# Patient Record
Sex: Male | Born: 1955 | Race: White | Hispanic: No | Marital: Married | Smoking: Never smoker
Health system: Southern US, Community
[De-identification: ages and names within clinical notes are randomized; demographics above are authoritative.]

## PROBLEM LIST (undated history)

## (undated) DIAGNOSIS — K5790 Diverticulosis of intestine, part unspecified, without perforation or abscess without bleeding: Secondary | ICD-10-CM

## (undated) DIAGNOSIS — C801 Malignant (primary) neoplasm, unspecified: Secondary | ICD-10-CM

## (undated) DIAGNOSIS — I82409 Acute embolism and thrombosis of unspecified deep veins of unspecified lower extremity: Secondary | ICD-10-CM

## (undated) DIAGNOSIS — J189 Pneumonia, unspecified organism: Secondary | ICD-10-CM

## (undated) DIAGNOSIS — M1990 Unspecified osteoarthritis, unspecified site: Secondary | ICD-10-CM

## (undated) HISTORY — PX: FRACTURE SURGERY: SHX138

## (undated) HISTORY — PX: BACK SURGERY: SHX140

## (undated) HISTORY — DX: Diverticulosis of intestine, part unspecified, without perforation or abscess without bleeding: K57.90

## (undated) HISTORY — PX: LOBECTOMY: SHX5089

## (undated) HISTORY — PX: TONSILLECTOMY: SUR1361

## (undated) HISTORY — PX: KNEE SURGERY: SHX244

## (undated) HISTORY — PX: ELBOW SURGERY: SHX618

---

## 1999-08-29 ENCOUNTER — Emergency Department (HOSPITAL_COMMUNITY): Admission: EM | Admit: 1999-08-29 | Discharge: 1999-08-29 | Payer: Self-pay | Admitting: Internal Medicine

## 1999-12-16 ENCOUNTER — Encounter: Admission: RE | Admit: 1999-12-16 | Discharge: 1999-12-16 | Payer: Self-pay | Admitting: Family Medicine

## 1999-12-16 ENCOUNTER — Encounter: Payer: Self-pay | Admitting: Family Medicine

## 1999-12-27 ENCOUNTER — Encounter: Payer: Self-pay | Admitting: Family Medicine

## 1999-12-27 ENCOUNTER — Encounter: Admission: RE | Admit: 1999-12-27 | Discharge: 1999-12-27 | Payer: Self-pay | Admitting: Family Medicine

## 2001-03-08 ENCOUNTER — Encounter: Admission: RE | Admit: 2001-03-08 | Discharge: 2001-03-08 | Payer: Self-pay | Admitting: Family Medicine

## 2001-03-08 ENCOUNTER — Encounter: Payer: Self-pay | Admitting: Family Medicine

## 2001-03-20 ENCOUNTER — Emergency Department (HOSPITAL_COMMUNITY): Admission: EM | Admit: 2001-03-20 | Discharge: 2001-03-20 | Payer: Self-pay | Admitting: Emergency Medicine

## 2002-09-07 ENCOUNTER — Encounter: Admission: RE | Admit: 2002-09-07 | Discharge: 2002-09-07 | Payer: Self-pay | Admitting: Family Medicine

## 2002-09-07 ENCOUNTER — Encounter: Payer: Self-pay | Admitting: Family Medicine

## 2002-11-13 ENCOUNTER — Emergency Department (HOSPITAL_COMMUNITY): Admission: AD | Admit: 2002-11-13 | Discharge: 2002-11-13 | Payer: Self-pay | Admitting: Emergency Medicine

## 2002-11-19 ENCOUNTER — Encounter: Payer: Self-pay | Admitting: Family Medicine

## 2002-11-19 ENCOUNTER — Encounter: Admission: RE | Admit: 2002-11-19 | Discharge: 2002-11-19 | Payer: Self-pay | Admitting: Family Medicine

## 2002-11-29 ENCOUNTER — Ambulatory Visit (HOSPITAL_COMMUNITY): Admission: RE | Admit: 2002-11-29 | Discharge: 2002-11-30 | Payer: Self-pay | Admitting: Neurosurgery

## 2002-11-29 ENCOUNTER — Encounter: Payer: Self-pay | Admitting: Neurosurgery

## 2006-04-15 ENCOUNTER — Emergency Department (HOSPITAL_COMMUNITY): Admission: EM | Admit: 2006-04-15 | Discharge: 2006-04-16 | Payer: Self-pay | Admitting: Emergency Medicine

## 2006-09-27 ENCOUNTER — Encounter: Admission: RE | Admit: 2006-09-27 | Discharge: 2006-09-27 | Payer: Self-pay | Admitting: Orthopedic Surgery

## 2006-10-02 ENCOUNTER — Ambulatory Visit: Payer: Self-pay | Admitting: Vascular Surgery

## 2010-06-29 NOTE — Procedures (Signed)
DUPLEX DEEP VENOUS EXAM - LOWER EXTREMITY   INDICATION:  Right calf pain, status post right knee surgery.   HISTORY:  Edema:  Yes  Trauma/Surgery:  Yes  Pain:  Yes  PE:  No  Previous DVT:  Yes  Anticoagulants:  Yes  Other:   DUPLEX EXAM:                CFV   SFV   PopV  PTV    GSV                R  L  R  L  R  L  R   L  R  L  Thrombosis    0  0  0     0     +      0  Spontaneous   +  +  +     +     0      +  Phasic        +  +  +     +     0      +  Augmentation  +  +  +     +     0      +  Compressible  +  +  +     +     0      +  Competent     +  +  +     +     0      +   Legend:  + - yes  o - no  p - partial  D - decreased   IMPRESSION:  Acute deep venous thrombosis noted in the right posterior  tibial vein, peroneal veins and the soleus veins.    _____________________________  Jason Hora Fields, MD   MG/MEDQ  D:  10/02/2006  T:  10/03/2006  Job:  [id number]

## 2010-07-02 NOTE — Op Note (Signed)
NAME:  Jason Sloan, Jason Sloan                          ACCOUNT NO.:  [id number]   MEDICAL RECORD NO.:  [id number]                   PATIENT TYPE:  OIB   LOCATION:  3009                                 FACILITY:  [facility]   PHYSICIAN:  Coletta Memos, M.D.                  DATE OF BIRTH:  Jun 24, 1955   DATE OF PROCEDURE:  11/29/2002  DATE OF DISCHARGE:                                 OPERATIVE REPORT   PREOPERATIVE DIAGNOSES:  1. Displaced disk, right L4-5.  2. Lumbar radiculopathy.   POSTOPERATIVE DIAGNOSES:  1. Displaced disk, right L4-5.  2. Lumbar radiculopathy.   PROCEDURE:  Right L4-5 semihemilaminectomy and diskectomy with  microdissection.   COMPLICATIONS:  None.   SURGEON:  Coletta Memos, M.D.   ANESTHESIA:  General endotracheal.   FINDINGS:  A large free fragment.  Disk space not entered.   [name] is a 55 year old gentleman with severe pain in his right lower  extremity secondary to a displaced disk.  I recommended and he agreed to go  to the operating room after he desired to have the quickest fix for his  problem.   Mr. Safranek was brought to the operating room, intubated, and placed under  general anesthesia without difficulty.  He was rolled prone onto a Wilson  frame and all pressure points were properly padded.  Skin was prepped and he  was draped in a sterile fashion.  I infiltrated 8 mL of 0.5% lidocaine and  1:200,000 strength epinephrine into the lumbar area into the skin and into  the paraspinous musculature.  I opened the skin with a #10 blade and took  this down to the thoracolumbar fascia.  I then exposed the laminae of what I  believed to be L4 and L5.  X-ray showed that I was in the correct  intralaminar space.  I then using a high-speed drill performed a  semihemilaminectomy.  I brought the microscope into position into the  operative field to aid in microdissection.  I then removed the ligamentum  flavum, exposing the thecal sac and spinal canal.  I  retracted the thecal  sac medially and appreciated a piece of disk which was coming out from  underneath the sac.  I then used a pituitary rongeur, pulled on this piece,  and expressed what was a very large free fragment of disk.  After satisfying  myself about the integrity of the disk space and that I was not able to  express any disk just by pressing on it, I did not feel it was warranted to  go into the disk space since I have removed the large fragment which was  obviously causing the problem.  I then irrigated the wound.  I then closed  the wound in a layered fashion using 0 Vicryl sutures.  Dermabond used for a  sterile dressing.  The patient tolerated the procedure without difficulty.  Coletta Memos, M.D.   KC/MEDQ  D:  11/29/2002  T:  11/30/2002  Job:  [id number]

## 2010-10-16 ENCOUNTER — Inpatient Hospital Stay (INDEPENDENT_AMBULATORY_CARE_PROVIDER_SITE_OTHER)
Admission: RE | Admit: 2010-10-16 | Discharge: 2010-10-16 | Disposition: A | Discharge status: Home / Self Care | Payer: BC Managed Care – PPO | Source: Ambulatory Visit | Attending: Family Medicine | Admitting: Family Medicine

## 2010-10-16 DIAGNOSIS — S91309A Unspecified open wound, unspecified foot, initial encounter: Secondary | ICD-10-CM

## 2014-01-15 ENCOUNTER — Encounter (HOSPITAL_BASED_OUTPATIENT_CLINIC_OR_DEPARTMENT_OTHER): Payer: Self-pay | Admitting: *Deleted

## 2014-01-15 ENCOUNTER — Emergency Department (HOSPITAL_BASED_OUTPATIENT_CLINIC_OR_DEPARTMENT_OTHER): Payer: BC Managed Care – PPO

## 2014-01-15 ENCOUNTER — Emergency Department (HOSPITAL_BASED_OUTPATIENT_CLINIC_OR_DEPARTMENT_OTHER)
Admission: EM | Admit: 2014-01-15 | Discharge: 2014-01-15 | Disposition: A | Discharge status: Home / Self Care | Payer: BC Managed Care – PPO | Attending: Emergency Medicine | Admitting: Emergency Medicine

## 2014-01-15 DIAGNOSIS — R112 Nausea with vomiting, unspecified: Secondary | ICD-10-CM | POA: Insufficient documentation

## 2014-01-15 DIAGNOSIS — R1011 Right upper quadrant pain: Secondary | ICD-10-CM | POA: Diagnosis not present

## 2014-01-15 DIAGNOSIS — Z9889 Other specified postprocedural states: Secondary | ICD-10-CM | POA: Diagnosis not present

## 2014-01-15 DIAGNOSIS — K625 Hemorrhage of anus and rectum: Secondary | ICD-10-CM | POA: Insufficient documentation

## 2014-01-15 DIAGNOSIS — R109 Unspecified abdominal pain: Secondary | ICD-10-CM

## 2014-01-15 DIAGNOSIS — R197 Diarrhea, unspecified: Secondary | ICD-10-CM | POA: Diagnosis not present

## 2014-01-15 DIAGNOSIS — Z880 Allergy status to penicillin: Secondary | ICD-10-CM | POA: Diagnosis not present

## 2014-01-15 LAB — CBC WITH DIFFERENTIAL/PLATELET
Basophils Absolute: 0.1 10*3/uL (ref 0.0–0.1)
Basophils Relative: 1 % (ref 0–1)
EOS ABS: 0.3 10*3/uL (ref 0.0–0.7)
Eosinophils Relative: 3 % (ref 0–5)
HCT: 45.3 % (ref 39.0–52.0)
HEMOGLOBIN: 15.9 g/dL (ref 13.0–17.0)
LYMPHS ABS: 2.6 10*3/uL (ref 0.7–4.0)
Lymphocytes Relative: 32 % (ref 12–46)
MCH: 31.4 pg (ref 26.0–34.0)
MCHC: 35.1 g/dL (ref 30.0–36.0)
MCV: 89.3 fL (ref 78.0–100.0)
MONOS PCT: 12 % (ref 3–12)
Monocytes Absolute: 1 10*3/uL (ref 0.1–1.0)
NEUTROS ABS: 4.2 10*3/uL (ref 1.7–7.7)
NEUTROS PCT: 52 % (ref 43–77)
Platelets: 204 10*3/uL (ref 150–400)
RBC: 5.07 MIL/uL (ref 4.22–5.81)
RDW: 13.3 % (ref 11.5–15.5)
WBC: 8.1 10*3/uL (ref 4.0–10.5)

## 2014-01-15 LAB — COMPREHENSIVE METABOLIC PANEL
ALT: 17 U/L (ref 0–53)
AST: 20 U/L (ref 0–37)
Albumin: 3.6 g/dL (ref 3.5–5.2)
Alkaline Phosphatase: 55 U/L (ref 39–117)
Anion gap: 10 (ref 5–15)
BUN: 16 mg/dL (ref 6–23)
CALCIUM: 9.4 mg/dL (ref 8.4–10.5)
CO2: 29 mEq/L (ref 19–32)
Chloride: 103 mEq/L (ref 96–112)
Creatinine, Ser: 1.1 mg/dL (ref 0.50–1.35)
GFR calc non Af Amer: 73 mL/min — ABNORMAL LOW (ref >90)
GFR, EST AFRICAN AMERICAN: 84 mL/min — AB (ref >90)
GLUCOSE: 71 mg/dL (ref 70–99)
Potassium: 4.5 mEq/L (ref 3.7–5.3)
SODIUM: 142 meq/L (ref 137–147)
TOTAL PROTEIN: 7.3 g/dL (ref 6.0–8.3)
Total Bilirubin: 0.4 mg/dL (ref 0.3–1.2)

## 2014-01-15 LAB — URINALYSIS, ROUTINE W REFLEX MICROSCOPIC
Bilirubin Urine: NEGATIVE
GLUCOSE, UA: NEGATIVE mg/dL
Hgb urine dipstick: NEGATIVE
Ketones, ur: 15 mg/dL — AB
LEUKOCYTES UA: NEGATIVE
Nitrite: NEGATIVE
PH: 5.5 (ref 5.0–8.0)
Protein, ur: NEGATIVE mg/dL
Specific Gravity, Urine: 1.02 (ref 1.005–1.030)
Urobilinogen, UA: 0.2 mg/dL (ref 0.0–1.0)

## 2014-01-15 LAB — LIPASE, BLOOD: Lipase: 36 U/L (ref 11–59)

## 2014-01-15 MED ORDER — MORPHINE SULFATE 4 MG/ML IJ SOLN
4.0000 mg | Freq: Once | INTRAMUSCULAR | Status: AC
  Administered 2014-01-15: 4 mg via INTRAVENOUS
  Filled 2014-01-15: qty 1

## 2014-01-15 MED ORDER — IOHEXOL 300 MG/ML  SOLN
100.0000 mL | Freq: Once | INTRAMUSCULAR | Status: AC | PRN
  Administered 2014-01-15: 100 mL via INTRAVENOUS

## 2014-01-15 MED ORDER — SODIUM CHLORIDE 0.9 % IV BOLUS (SEPSIS)
1000.0000 mL | Freq: Once | INTRAVENOUS | Status: AC
  Administered 2014-01-15: 1000 mL via INTRAVENOUS

## 2014-01-15 MED ORDER — HYDROCODONE-ACETAMINOPHEN 5-325 MG PO TABS: 1.0000 | ORAL_TABLET | Freq: Four times a day (QID) | ORAL | Status: DC | PRN

## 2014-01-15 MED ORDER — ONDANSETRON HCL 4 MG/2ML IJ SOLN
4.0000 mg | Freq: Once | INTRAMUSCULAR | Status: AC
  Administered 2014-01-15: 4 mg via INTRAVENOUS
  Filled 2014-01-15: qty 2

## 2014-01-15 MED ORDER — IOHEXOL 300 MG/ML  SOLN
50.0000 mL | Freq: Once | INTRAMUSCULAR | Status: AC | PRN
  Administered 2014-01-15: 50 mL via ORAL

## 2014-01-15 NOTE — ED Notes (Addendum)
Pt c/o abd pain n/v  x 8 hrs and rectal bleeding x 2 hrs

## 2014-01-15 NOTE — ED Notes (Signed)
Patient transported to CT 

## 2014-01-15 NOTE — Discharge Instructions (Signed)
Hydrocodone as prescribed as needed for pain.  Call [facility] to arrange a follow-up appointment to discuss a colonoscopy, and return to the ER if you develop any worsening symptoms.   Abdominal Pain Many things can cause abdominal pain. Usually, abdominal pain is not caused by a disease and will improve without treatment. It can often be observed and treated at home. Your health care provider will do a physical exam and possibly order blood tests and X-rays to help determine the seriousness of your pain. However, in many cases, more time must pass before a clear cause of the pain can be found. Before that point, your health care provider may not know if you need more testing or further treatment. HOME CARE INSTRUCTIONS  Monitor your abdominal pain for any changes. The following actions may help to alleviate any discomfort you are experiencing:  Only take over-the-counter or prescription medicines as directed by your health care provider.  Do not take laxatives unless directed to do so by your health care provider.  Try a clear liquid diet (broth, tea, or water) as directed by your health care provider. Slowly move to a bland diet as tolerated. SEEK MEDICAL CARE IF:  You have unexplained abdominal pain.  You have abdominal pain associated with nausea or diarrhea.  You have pain when you urinate or have a bowel movement.  You experience abdominal pain that wakes you in the night.  You have abdominal pain that is worsened or improved by eating food.  You have abdominal pain that is worsened with eating fatty foods.  You have a fever. SEEK IMMEDIATE MEDICAL CARE IF:   Your pain does not go away within 2 hours.  You keep throwing up (vomiting).  Your pain is felt only in portions of the abdomen, such as the right side or the left lower portion of the abdomen.  You pass bloody or black tarry stools. MAKE SURE YOU:  Understand these instructions.   Will watch your  condition.   Will get help right away if you are not doing well or get worse.  Document Released: 11/10/2004 Document Revised: 02/05/2013 Document Reviewed: 10/10/2012 [facility] Patient Information 2015 [place], [place]. This information is not intended to replace advice given to you by your health care provider. Make sure you discuss any questions you have with your health care provider.  Bloody Stools Bloody stools often mean that there is a problem in the digestive tract. Your caregiver may use the term "melena" to describe black, tarry, and bad smelling stools or "hematochezia" to describe red or maroon-colored stools. Blood seen in the stool can be caused by bleeding anywhere along the intestinal tract.  A black stool usually means that blood is coming from the upper part of the gastrointestinal tract (esophagus, stomach, or small bowel). Passing maroon-colored stools or bright red blood usually means that blood is coming from lower down in the large bowel or the rectum. However, sometimes massive bleeding in the stomach or small intestine can cause bright red bloody stools.  Consuming black licorice, lead, iron pills, medicines containing bismuth subsalicylate, or blueberries can also cause black stools. Your caregiver can test black stools to see if blood is present. It is important that the cause of the bleeding be found. Treatment can then be started, and the problem can be corrected. Rectal bleeding may not be serious, but you should not assume everything is okay until you know the cause.It is very important to follow up with your caregiver or  a specialist in gastrointestinal problems. CAUSES  Blood in the stools can come from various underlying causes.Often, the cause is not found during your first visit. Testing is often needed to discover the cause of bleeding in the gastrointestinal tract. Causes range from simple to serious or even life-threatening.Possible causes  include:  Hemorrhoids.These are veins that are full of blood (engorged) in the rectum. They cause pain, inflammation, and may bleed.  Anal fissures.These are areas of painful tearing which may bleed. They are often caused by passing hard stool.  Diverticulosis.These are pouches that form on the colon over time, with age, and may bleed significantly.  Diverticulitis.This is inflammation in areas with diverticulosis. It can cause pain, fever, and bloody stools, although bleeding is rare.  Proctitis and colitis. These are inflamed areas of the rectum or colon. They may cause pain, fever, and bloody stools.  Polyps and cancer. Colon cancer is a leading cause of preventable cancer death.It often starts out as precancerous polyps that can be removed during a colonoscopy, preventing progression into cancer. Sometimes, polyps and cancer may cause rectal bleeding.  Gastritis and ulcers.Bleeding from the upper gastrointestinal tract (near the stomach) may travel through the intestines and produce black, sometimes tarry, often bad smelling stools. In certain cases, if the bleeding is fast enough, the stools may not be black, but red and the condition may be life-threatening. SYMPTOMS  You may have stools that are bright red and bloody, that are normal color with blood on them, or that are dark black and tarry. In some cases, you may only have blood in the toilet bowl. Any of these cases need medical care. You may also have:  Pain at the anus or anywhere in the rectum.  Lightheadedness or feeling faint.  Extreme weakness.  Nausea or vomiting.  Fever. DIAGNOSIS Your caregiver may use the following methods to find the cause of your bleeding:  Taking a medical history. Age is important. Older people tend to develop polyps and cancer more often. If there is anal pain and a hard, large stool associated with bleeding, a tear of the anus may be the cause. If blood drips into the toilet after a bowel  movement, bleeding hemorrhoids may be the problem. The color and frequency of the bleeding are additional considerations. In most cases, the medical history provides clues, but seldom the final answer.  A visual and finger (digital) exam. Your caregiver will inspect the anal area, looking for tears and hemorrhoids. A finger exam can provide information when there is tenderness or a growth inside. In men, the prostate is also examined.  Endoscopy. Several types of small, long scopes (endoscopes) are used to view the colon.  In the office, your caregiver may use a rigid, or more commonly, a flexible viewing sigmoidoscope. This exam is called flexible sigmoidoscopy. It is performed in 5 to 10 minutes.  A more thorough exam is accomplished with a colonoscope. It allows your caregiver to view the entire 5 to 6 foot long colon. Medicine to help you relax (sedative) is usually given for this exam. Frequently, a bleeding lesion may be present beyond the reach of the sigmoidoscope. So, a colonoscopy may be the best exam to start with. Both exams are usually done on an outpatient basis. This means the patient does not stay overnight in the hospital or surgery center.  An upper endoscopy may be needed to examine your stomach. Sedation is used and a flexible endoscope is put in your mouth, down to  your stomach.  A barium enema X-ray. This is an X-ray exam. It uses liquid barium inserted by enema into the rectum. This test alone may not identify an actual bleeding point. X-rays highlight abnormal shadows, such as those made by lumps (tumors), diverticuli, or colitis. TREATMENT  Treatment depends on the cause of your bleeding.   For bleeding from the stomach or colon, the caregiver doing your endoscopy or colonoscopy may be able to stop the bleeding as part of the procedure.  Inflammation or infection of the colon can be treated with medicines.  Many rectal problems can be treated with creams, suppositories,  or warm baths.  Surgery is sometimes needed.  Blood transfusions are sometimes needed if you have lost a lot of blood.  For any bleeding problem, let your caregiver know if you take aspirin or other blood thinners regularly. HOME CARE INSTRUCTIONS   Take any medicines exactly as prescribed.  Keep your stools soft by eating a diet high in fiber. Prunes (1 to 3 a day) work well for many people.  Drink enough water and fluids to keep your urine clear or pale yellow.  Take sitz baths if advised. A sitz bath is when you sit in a bathtub with warm water for 10 to 15 minutes to soak, soothe, and cleanse the rectal area.  If enemas or suppositories are advised, be sure you know how to use them. Tell your caregiver if you have problems with this.  Monitor your bowel movements to look for signs of improvement or worsening. SEEK MEDICAL CARE IF:   You do not improve in the time expected.  Your condition worsens after initial improvement.  You develop any new symptoms. SEEK IMMEDIATE MEDICAL CARE IF:   You develop severe or prolonged rectal bleeding.  You vomit blood.  You feel weak or faint.  You have a fever. MAKE SURE YOU:  Understand these instructions.  Will watch your condition.  Will get help right away if you are not doing well or get worse. Document Released: 01/21/2002 Document Revised: 04/25/2011 Document Reviewed: 06/18/2010 [facility] Patient Information 2015 [place], [place]. This information is not intended to replace advice given to you by your health care provider. Make sure you discuss any questions you have with your health care provider.

## 2014-01-15 NOTE — ED Provider Notes (Signed)
CSN: [id number]     Arrival date & time 01/15/14  1614 History   First MD Initiated Contact with Patient 01/15/14 1649     Chief Complaint  Patient presents with  . Rectal Bleeding     (Consider location/radiation/quality/duration/timing/severity/associated sxs/prior Treatment) HPI Comments: Patient is a 58 year old male with a prior history of lung and back surgery. He presents today with complaints of right upper quadrant pain that started this morning and has been ongoing for the last 8 hours. He reports nausea and vomiting. He had a bowel movement this afternoon and noticed blood afterward when he wiped. He denies to me he is having any fevers or chills. He denies any urinary complaints. He has no prior abdominal surgeries and denies having had a colonoscopy in the past.  Patient is a 58 y.o. male presenting with hematochezia. The history is provided by the patient.  Rectal Bleeding Quality:  Bright red Amount:  Scant Duration:  8 hours Timing:  Constant Progression:  Worsening Chronicity:  New Context: diarrhea   Context: not anal fissures and not constipation   Relieved by:  Nothing Worsened by:  Nothing tried Ineffective treatments:  None tried Associated symptoms: abdominal pain   Associated symptoms: no fever     History reviewed. No pertinent past medical history. Past Surgical History  Procedure Laterality Date  . Back surgery    . Joint replacement    . Lobectomy    . Tonsillectomy     History reviewed. No pertinent family history. History  Substance Use Topics  . Smoking status: Never Smoker   . Smokeless tobacco: Not on file  . Alcohol Use: No    Review of Systems  Constitutional: Negative for fever.  Gastrointestinal: Positive for abdominal pain and hematochezia.  All other systems reviewed and are negative.     Allergies  Penicillins  Home Medications   Prior to Admission medications   Not on File   BP 125/71 mmHg  Pulse 60  Temp(Src)  98.2 F (36.8 C) (Oral)  Resp 18  Ht 6\' 4"  (1.93 m)  Wt 240 lb (108.863 kg)  BMI 29.23 kg/m2  SpO2 100% Physical Exam  Constitutional: He is oriented to person, place, and time. He appears well-developed and well-nourished. No distress.  HENT:  Head: Normocephalic and atraumatic.  Mouth/Throat: Oropharynx is clear and moist.  Neck: Normal range of motion. Neck supple.  Cardiovascular: Normal rate, regular rhythm and normal heart sounds.   No murmur heard. Pulmonary/Chest: Effort normal and breath sounds normal. No respiratory distress.  Abdominal: Soft. Bowel sounds are normal. He exhibits no distension. There is tenderness. There is no rebound and no guarding.  There is tenderness to palpation in the right upper quadrant.  Musculoskeletal: Normal range of motion. He exhibits no edema.  Neurological: He is alert and oriented to person, place, and time.  Skin: Skin is warm and dry. He is not diaphoretic.  Nursing note and vitals reviewed.   ED Course  Procedures (including critical care time) Labs Review Labs Reviewed  COMPREHENSIVE METABOLIC PANEL  CBC WITH DIFFERENTIAL  LIPASE, BLOOD  URINALYSIS, ROUTINE W REFLEX MICROSCOPIC    Imaging Review No results found.   EKG Interpretation None      MDM   Final diagnoses:  None    Patient is a 58 year old male who presents with complaints of significant right upper quadrant discomfort and a bloody bowel movement that occurred shortly after the onset of pain. On exam, he is tender to  palpation in the right midabdomen with no rebound or guarding. Workup reveals no elevation of white count, normal laboratory studies. An ultrasound was obtained initially which revealed sludge but no evidence for cholelithiasis, and CT scan reveals no acute abnormality.  I am uncertain as to the exact etiology of the patient's discomfort, however nothing appears emergent. He will be discharged home with pain medication and when necessary return.  Due to the bloody bowel movement, I will advise him to call his gastroenterologist to arrange a follow-up appointment to discuss a colonoscopy.    Veryl Speak, MD 01/15/14 2123

## 2014-01-17 ENCOUNTER — Encounter: Payer: Self-pay | Admitting: *Deleted

## 2014-01-20 ENCOUNTER — Encounter: Payer: Self-pay | Admitting: Physician Assistant

## 2014-01-20 ENCOUNTER — Other Ambulatory Visit (INDEPENDENT_AMBULATORY_CARE_PROVIDER_SITE_OTHER): Payer: BC Managed Care – PPO

## 2014-01-20 ENCOUNTER — Ambulatory Visit (INDEPENDENT_AMBULATORY_CARE_PROVIDER_SITE_OTHER): Payer: BC Managed Care – PPO | Admitting: Physician Assistant

## 2014-01-20 VITALS — BP 112/64 | HR 60 | Ht 76.0 in | Wt 238.0 lb

## 2014-01-20 DIAGNOSIS — K625 Hemorrhage of anus and rectum: Secondary | ICD-10-CM

## 2014-01-20 DIAGNOSIS — R1011 Right upper quadrant pain: Secondary | ICD-10-CM | POA: Insufficient documentation

## 2014-01-20 DIAGNOSIS — Z1211 Encounter for screening for malignant neoplasm of colon: Secondary | ICD-10-CM

## 2014-01-20 LAB — HEPATIC FUNCTION PANEL
ALK PHOS: 52 U/L (ref 39–117)
ALT: 19 U/L (ref 0–53)
AST: 19 U/L (ref 0–37)
Albumin: 4.1 g/dL (ref 3.5–5.2)
BILIRUBIN DIRECT: 0.1 mg/dL (ref 0.0–0.3)
Total Bilirubin: 0.8 mg/dL (ref 0.2–1.2)
Total Protein: 7.2 g/dL (ref 6.0–8.3)

## 2014-01-20 LAB — LIPASE: LIPASE: 36 U/L (ref 11.0–59.0)

## 2014-01-20 MED ORDER — MOVIPREP 100 G PO SOLR: 1.0000 | Freq: Once | ORAL | Status: DC

## 2014-01-20 MED ORDER — PANTOPRAZOLE SODIUM 40 MG PO TBEC
40.0000 mg | DELAYED_RELEASE_TABLET | Freq: Every day | ORAL | Status: DC
Start: 2014-01-20 — End: 2017-05-18

## 2014-01-20 NOTE — Progress Notes (Signed)
Patient ID: Jason Sloan, male   DOB: Oct 28, 1955, 58 y.o.   MRN: [id number]    HPI:  [name] is a 58 year old male referred for evaluation by Dr. Tennis Must low due to right upper quadrant abdominal pain. Patient has a history of prior lung and back surgery.  Patient states that last Wednesday he developed severe right upper quadrant pain that radiated through to the area of his right shoulder blade. The pain did not occur postprandially. The pain was associated with severe nausea and several bouts of vomiting the pain lasted for 8-10 hours. Towards the end of the episode he had a bowel movement and noticed a small amount of blood on the toilet tissue. He had no associated fever or chills and had no urinary complaints. His right upper quadrant pain has remained constant and dull but is quite bothersome for the patient. He has not had any further nausea or vomiting but he states the pain continues to feel like an ache from the right upper quadrant through to the right shoulder blade. He has not had any dark urine or light stools. He was seen in the emergency room and had a CT of the abdomen that showed colonic diverticulosis without diverticulitis. He had an ultrasound that showed a small echogenic gallbladder polyp and layering echogenic sludge but no gallstones or findings for acute cholecystitis. Patient is anxious to have further evaluation of this discomfort as he states his sister had her gallbladder removed in her 18s, his mother had her gallbladder removed in her 9s, and his father had gallbladder cancer. The patient was given a prescription for Norco for his pain in the emergency room. He has only used 3 tablets in the past week as it provides little relief. He denies heartburn, but states he feels very bloated and has been belching and burping a lot.    Past Medical History  Diagnosis Date  . Diverticulosis   . Fatty liver 01/15/14  . Gallbladder polyp   . Kidney cysts     Past Surgical History    Procedure Laterality Date  . Back surgery    . Knee surgery Right   . Lobectomy Right     due to cryptococcal pneumonia  . Tonsillectomy     Family History  Problem Relation Age of Onset  . Stomach cancer Father     ??  . Colon cancer Neg Hx   . Throat cancer Neg Hx   . Pancreatic cancer Neg Hx   . Diabetes Maternal Grandfather   . Kidney disease Neg Hx   . Liver disease Neg Hx   . Heart disease Neg Hx    History  Substance Use Topics  . Smoking status: Never Smoker   . Smokeless tobacco: Current User     Comment: occasional  . Alcohol Use: 0.0 oz/week    0 Not specified per week     Comment: seldom   Current Outpatient Prescriptions  Medication Sig Dispense Refill  . HYDROcodone-acetaminophen (NORCO) 5-325 MG per tablet Take 1-2 tablets by mouth every 6 (six) hours as needed. 12 tablet 0  . MOVIPREP 100 G SOLR Take 1 kit (200 g total) by mouth once. 1 kit 0  . pantoprazole (PROTONIX) 40 MG tablet Take 1 tablet (40 mg total) by mouth daily. 3 tablet 2   No current facility-administered medications for this visit.   Allergies  Allergen Reactions  . Penicillins      Review of Systems: Gen: Denies any fever, chills,  sweats, anorexia, fatigue, weakness, malaise, weight loss, and sleep disorder CV: Denies chest pain, angina, palpitations, syncope, orthopnea, PND, peripheral edema, and claudication. Resp: Denies dyspnea at rest, dyspnea with exercise, cough, sputum, wheezing, coughing up blood, and pleurisy. GI: Denies vomiting blood, jaundice, and fecal incontinence.   Denies dysphagia or odynophagia. GU : Denies urinary burning, blood in urine, urinary frequency, urinary hesitancy, nocturnal urination, and urinary incontinence. MS: Denies joint pain, limitation of movement, and swelling, stiffness, low back pain, extremity pain. Denies muscle weakness, cramps, atrophy.  Derm: Denies rash, itching, dry skin, hives, moles, warts, or unhealing ulcers.  Psych: Denies  depression, anxiety, memory loss, suicidal ideation, hallucinations, paranoia, and confusion. Heme: Denies bruising, bleeding, and enlarged lymph nodes. Neuro:  Denies any headaches, dizziness, paresthesias. Endo:  Denies any problems with DM, thyroid, adrenal function  Studies: US Abdomen Complete  01/15/2014   CLINICAL DATA:  Acute onset of severe right upper quadrant abdominal pain approximately 8-9 hr ago with rectal bleeding.  EXAM: ULTRASOUND ABDOMEN COMPLETE  COMPARISON:  None.  FINDINGS: Gallbladder: A small 3 mm echogenic polyp is noted. There is a small amount of layering echogenic sludge but no gallstones, wall thickening or pericholecystic fluid.  Common bile duct: Diameter: 4 mm  Liver: There is diffuse increased echogenicity of the liver and decreased through transmission consistent with fatty infiltration. No focal lesions or biliary dilatation.  IVC: Normal caliber  Pancreas: Sonographically unremarkable  Spleen: Normal size and echogenicity without focal lesions  Right Kidney: Length: 11.6 cm. Normal renal cortical thickness and echogenicity without focal lesions or hydronephrosis. A 3.3 x 2.7 x 2.5 cm cyst is noted.  Left Kidney: Length: 12.8 cm. Normal renal cortical thickness and echogenicity without focal lesions or hydronephrosis. A 2.4 x 2.1 x 1.7 cm cyst is noted.  Abdominal aorta: Normal caliber  Other findings: None.  IMPRESSION: 1. Small echogenic gallbladder polyp and layering echogenic sludge but no gallstones or findings for acute cholecystitis. 2. Normal caliber common bile duct. 3. Diffuse fatty infiltration of the liver. 4. Bilateral simple appearing renal cysts.   Electronically Signed   By: Kalman Jewels M.D.   On: 01/15/2014 18:44   Ct Abdomen Pelvis W Contrast  01/15/2014   CLINICAL DATA:  58 year old male with right-sided abdominal and pelvic pain and rectal bleeding for 1 day. Initial encounter.  EXAM: CT ABDOMEN AND PELVIS WITH CONTRAST  TECHNIQUE: Multidetector CT  imaging of the abdomen and pelvis was performed using the standard protocol following bolus administration of intravenous contrast.  CONTRAST:  199m OMNIPAQUE IOHEXOL 300 MG/ML  SOLN  COMPARISON:  01/15/2014 abdominal ultrasound.  04/16/2006 chest CT.  FINDINGS: Two right lower lobe nodules are unchanged from 2008-benign.  A right hepatic cyst is again noted but the liver is otherwise unremarkable.  The spleen, adrenal glands, pancreas, gallbladder and kidneys are unremarkable except for bilateral renal cysts.  There is no evidence of free fluid, enlarged lymph nodes, biliary dilation or abdominal aortic aneurysm.  The bowel, bladder and appendix are unremarkable except for descending and sigmoid colonic diverticulosis. There is no evidence of bowel obstruction, abscess or pneumoperitoneum.  Mild prostate enlargement noted.  No acute or suspicious bony abnormalities are identified.  IMPRESSION: No evidence of acute abnormality.  Colonic diverticulosis without diverticulitis.  Mild prostate enlargement.   Electronically Signed   By: JHassan RowanM.D.   On: 01/15/2014 21:11    LAB RESULTS: Chem profile on 01/15/2014 showed alkaline phosphatase 55, lipase 36, AST 20, ALT  17, total bili 0.4. CBC on 01/15/2014 had a white blood cell count 8.1, hemoglobin 15.9, hematocrit 45.3, platelets 204,000.    Physical Exam: BP 112/64 mmHg  Pulse 60  Ht _0  (1.93 m)  Wt 238 lb (107.956 kg)  BMI 28.98 kg/m2 Constitutional: Pleasant,well-developed, male in no acute distress. HEENT: Normocephalic and atraumatic. Conjunctivae are normal. No scleral icterus. Neck supple.  Cardiovascular: Normal rate, regular rhythm.  Pulmonary/chest: Effort normal and breath sounds normal. No wheezing, rales or rhonchi. Abdominal: Soft, nondistended, tender RUQ, no rebound or guarding. Bowel sounds active throughout. There are no masses palpable. No hepatomegaly. Extremities: no edema Lymphadenopathy: No cervical adenopathy  noted. Neurological: Alert and oriented to person place and time. Skin: Skin is warm and dry. No rashes noted. Psychiatric: Normal mood and affect. Behavior is normal.  ASSESSMENT AND PLAN: #1 right upper quadrant pain. Patient continues to have right upper quadrant pain radiating to the right scapula associated with belching, burping, and bloating. His ultrasound did show a gallbladder polyp and some sludge, but no cholecystitis and no ductal dilatation. An antireflux regimen has been reviewed with the patient. He will be given a trial of pantoprazole 40 mg by mouth every morning 30 minutes prior to breakfast. He will be scheduled for an EGD to assess for possible esophagitis, gastritis, ulcer, or duodenitis.The risks, benefits, and alternatives to endoscopy with possible biopsy and possible dilation were discussed with the patient and they consent to proceed. If the EGD is nonrevealing, he will be considered for a HIDA scan for possible surgical evaluation.  #2. Need for colorectal cancer screening. Patient has expressed interest in being scheduled for a colonoscopy. This will be scheduled on an elective basis after his other problems are sorted out.The risks, benefits, and alternatives to colonoscopy with possible biopsy and possible polypectomy were discussed with the patient and they consent to proceed.   Lindsay Soulliere, Vita Barley PA-C 01/20/2014, 11:36 AM

## 2014-01-20 NOTE — Patient Instructions (Signed)
You have been scheduled for an endoscopy. Please follow written instructions given to you at your visit today. If you use inhalers (even only as needed), please bring them with you on the day of your procedure. Your physician has requested that you go to www.startemmi.com and enter the access code given to you at your visit today. This web site gives a general overview about your procedure. However, you should still follow specific instructions given to you by our office regarding your preparation for the procedure.  You have been scheduled for a colonoscopy. Please follow written instructions given to you at your visit today.  Please pick up your prep kit at the pharmacy within the next 1-3 days. If you use inhalers (even only as needed), please bring them with you on the day of your procedure. Your physician has requested that you go to www.startemmi.com and enter the access code given to you at your visit today. This web site gives a general overview about your procedure. However, you should still follow specific instructions given to you by our office regarding your preparation for the procedure.  We have sent the following medications to your pharmacy for you to pick up at your convenience: Pantoprazole 40 mg every morning 30 minutes prior to breakfast  Food Choices for Gastroesophageal Reflux Disease When you have gastroesophageal reflux disease (GERD), the foods you eat and your eating habits are very important. Choosing the right foods can help ease the discomfort of GERD. WHAT GENERAL GUIDELINES DO I NEED TO FOLLOW?  Choose fruits, vegetables, whole grains, low-fat dairy products, and low-fat meat, fish, and poultry.  Limit fats such as oils, salad dressings, butter, nuts, and avocado.  Keep a food diary to identify foods that cause symptoms.  Avoid foods that cause reflux. These may be different for different people.  Eat frequent small meals instead of three large meals each  day.  Eat your meals slowly, in a relaxed setting.  Limit fried foods.  Cook foods using methods other than frying.  Avoid drinking alcohol.  Avoid drinking large amounts of liquids with your meals.  Avoid bending over or lying down until 2-3 hours after eating. WHAT FOODS ARE NOT RECOMMENDED? The following are some foods and drinks that may worsen your symptoms: Vegetables Tomatoes. Tomato juice. Tomato and spaghetti sauce. Chili peppers. Onion and garlic. Horseradish. Fruits Oranges, grapefruit, and lemon (fruit and juice). Meats High-fat meats, fish, and poultry. This includes hot dogs, ribs, ham, sausage, salami, and bacon. Dairy Whole milk and chocolate milk. Sour cream. Cream. Butter. Ice cream. Cream cheese.  Beverages Coffee and tea, with or without caffeine. Carbonated beverages or energy drinks. Condiments Hot sauce. Barbecue sauce.  Sweets/Desserts Chocolate and cocoa. Donuts. Peppermint and spearmint. Fats and Oils High-fat foods, including [place] fries and potato chips. Other Vinegar. Strong spices, such as black pepper, white pepper, red pepper, cayenne, curry powder, cloves, ginger, and chili powder. The items listed above may not be a complete list of foods and beverages to avoid. Contact your dietitian for more information. Document Released: 01/31/2005 Document Revised: 02/05/2013 Document Reviewed: 12/05/2012 [facility] Patient Information 2015 [place], [place]. This information is not intended to replace advice given to you by your health care provider. Make sure you discuss any questions you have with your health care provider. ____________________________________________________________________________________________________________________________________________________ Your physician has requested that you go to the basement for the following lab work before leaving today: Lipase, Hepatic Panel  Cc:  Kathryne Eriksson

## 2014-01-20 NOTE — Progress Notes (Signed)
I agree with the above note, plan 

## 2014-01-24 ENCOUNTER — Encounter: Payer: Self-pay | Admitting: Gastroenterology

## 2014-01-24 ENCOUNTER — Ambulatory Visit (AMBULATORY_SURGERY_CENTER): Payer: BC Managed Care – PPO | Admitting: Gastroenterology

## 2014-01-24 VITALS — BP 111/71 | HR 43 | Temp 97.3°F | Resp 18 | Ht 76.0 in | Wt 238.0 lb

## 2014-01-24 DIAGNOSIS — K2970 Gastritis, unspecified, without bleeding: Secondary | ICD-10-CM

## 2014-01-24 DIAGNOSIS — K2990 Gastroduodenitis, unspecified, without bleeding: Secondary | ICD-10-CM

## 2014-01-24 DIAGNOSIS — R1011 Right upper quadrant pain: Secondary | ICD-10-CM

## 2014-01-24 DIAGNOSIS — K2950 Unspecified chronic gastritis without bleeding: Secondary | ICD-10-CM

## 2014-01-24 MED ORDER — SODIUM CHLORIDE 0.9 % IV SOLN: 500.0000 mL | INTRAVENOUS | Status: DC

## 2014-01-24 NOTE — Progress Notes (Signed)
Report to PACU, RN, vss, BBS= Clear.  

## 2014-01-24 NOTE — Progress Notes (Signed)
Called to room to assist during endoscopic procedure.  Patient ID and intended procedure confirmed with present staff. Received instructions for my participation in the procedure from the performing physician.  

## 2014-01-24 NOTE — Patient Instructions (Signed)

## 2014-01-24 NOTE — Op Note (Signed)
[facility]  [address] [place], [address]   ENDOSCOPY PROCEDURE REPORT  PATIENT: Jason Sloan, Jason Sloan  MR#: [id number] BIRTHDATE: 09-Mar-1955 , 38  yrs. old GENDER: male ENDOSCOPIST: Milus Banister, MD REFERRED BY:  Kathryne Eriksson, MD PROCEDURE DATE:  01/24/2014 PROCEDURE:  EGD w/ biopsy ASA CLASS:     Class II INDICATIONS:  RUQ abdominal pain; normal CBC, cmet; US showed sludge, ?GB polyp but no obvious stones. MEDICATIONS: Monitored anesthesia care and Propofol 200 mg IV TOPICAL ANESTHETIC: none  DESCRIPTION OF PROCEDURE: After the risks benefits and alternatives of the procedure were thoroughly explained, informed consent was obtained.  The LB [id number] endoscope was introduced through the mouth and advanced to the second portion of the duodenum , Without limitations.  The instrument was slowly withdrawn as the mucosa was fully examined.  There was mild, non-specific pan gastritis.  This was biopsied and sent to pathology.  The examination was otherwise normal. Retroflexed views revealed no abnormalities.     The scope was then withdrawn from the patient and the procedure completed.  COMPLICATIONS: There were no immediate complications.  ENDOSCOPIC IMPRESSION: There was mild, non-specific pan gastritis.  This was biopsied and sent to pathology.  The examination was otherwise normal  RECOMMENDATIONS: If biopsies are positive for H.  pylori, you will be started on appropriate antibiotics.  IF not, then will likely arrange for futher gallbladder testing (HIDA scan).    eSigned:  Milus Banister, MD 01/24/2014 1:33 PM

## 2014-01-27 ENCOUNTER — Telehealth: Payer: Self-pay | Admitting: *Deleted

## 2014-01-27 NOTE — Telephone Encounter (Signed)
  Follow up Call-  Call back number 01/24/2014  Post procedure Call Back phone  # [phone]  Permission to leave phone message Yes     Patient questions:  Do you have a fever, pain , or abdominal swelling? No. Pain Score  0 *  Have you tolerated food without any problems? Yes.    Have you been able to return to your normal activities? Yes.    Do you have any questions about your discharge instructions: Diet   No. Medications  No. Follow up visit  No.  Do you have questions or concerns about your Care? No.  Actions: * If pain score is 4 or above: No action needed, pain <4.

## 2014-02-04 ENCOUNTER — Telehealth: Payer: Self-pay | Admitting: Gastroenterology

## 2014-02-04 DIAGNOSIS — R1011 Right upper quadrant pain: Secondary | ICD-10-CM

## 2014-02-04 NOTE — Telephone Encounter (Signed)
Jason Sloan, please call him. Biopsies from stomach showed no H. Pylori. Still not clear what is causing his RUQ symptoms, want further testing of the GB with HIDA, cck to estimate GB EF. Thanks                 You have been scheduled for a HIDA scan at [facility]iology (1st floor) on 02/17/14. Please arrive 15 minutes prior to your scheduled appointment at  1 pm. Make certain not to have anything to eat or drink at least 6 hours prior to your test. Should this appointment date or time not work well for you, please call radiology scheduling at [phone].  _____________________________________________________________________ hepatobiliary (HIDA) scan is an imaging procedure used to diagnose problems in the liver, gallbladder and bile ducts. In the HIDA scan, a radioactive chemical or tracer is injected into a vein in your arm. The tracer is handled by the liver like bile. Bile is a fluid produced and excreted by your liver that helps your digestive system break down fats in the foods you eat. Bile is stored in your gallbladder and the gallbladder releases the bile when you eat a meal. A special nuclear medicine scanner (gamma camera) tracks the flow of the tracer from your liver into your gallbladder and small intestine.  During your HIDA scan  You'll be asked to change into a hospital gown before your HIDA scan begins. Your health care team will position you on a table, usually on your back. The radioactive tracer is then injected into a vein in your arm.The tracer travels through your bloodstream to your liver, where it's taken up by the bile-producing cells. The radioactive tracer travels with the bile from your liver into your gallbladder and through your bile ducts to your small intestine.You may feel some pressure while the radioactive tracer is injected into your vein. As you lie on the table, a special gamma camera is positioned over your abdomen taking pictures of the tracer as it moves  through your body. The gamma camera takes pictures continually for about an hour. You'll need to keep still during the HIDA scan. This can become uncomfortable, but you may find that you can lessen the discomfort by taking deep breaths and thinking about other things. Tell your health care team if you're uncomfortable. The radiologist will watch on a computer the progress of the radioactive tracer through your body. The HIDA scan may be stopped when the radioactive tracer is seen in the gallbladder and enters your small intestine. This typically takes about an hour. In some cases extra imaging will be performed if original images aren't satisfactory, if morphine is given to help visualize the gallbladder or if the medication CCK is given to look at the contraction of the gallbladder. This test typically takes 2 hours to complete. ________________________________  Pt has been notified and instructed

## 2014-02-10 ENCOUNTER — Encounter: Payer: Self-pay | Admitting: Gastroenterology

## 2014-02-17 ENCOUNTER — Ambulatory Visit (HOSPITAL_COMMUNITY)
Admission: RE | Admit: 2014-02-17 | Discharge: 2014-02-17 | Disposition: A | Discharge status: Home / Self Care | Payer: BC Managed Care – PPO | Source: Ambulatory Visit | Attending: Gastroenterology | Admitting: Gastroenterology

## 2014-02-17 DIAGNOSIS — R1011 Right upper quadrant pain: Secondary | ICD-10-CM | POA: Diagnosis present

## 2014-02-17 MED ORDER — TECHNETIUM TC 99M MEBROFENIN IV KIT
5.1000 | PACK | Freq: Once | INTRAVENOUS | Status: AC | PRN
  Administered 2014-02-17: 5 via INTRAVENOUS

## 2014-02-17 MED ORDER — SINCALIDE 5 MCG IJ SOLR
0.0200 ug/kg | Freq: Once | INTRAMUSCULAR | Status: AC
  Administered 2014-02-17: 2.2 ug via INTRAVENOUS

## 2014-03-21 ENCOUNTER — Encounter: Payer: Self-pay | Admitting: Gastroenterology

## 2014-03-21 ENCOUNTER — Ambulatory Visit (AMBULATORY_SURGERY_CENTER): Payer: BC Managed Care – PPO | Admitting: Gastroenterology

## 2014-03-21 ENCOUNTER — Encounter: Payer: BC Managed Care – PPO | Admitting: Gastroenterology

## 2014-03-21 VITALS — BP 115/45 | HR 61 | Temp 96.8°F | Resp 16 | Ht 76.0 in | Wt 238.0 lb

## 2014-03-21 DIAGNOSIS — Z1211 Encounter for screening for malignant neoplasm of colon: Secondary | ICD-10-CM

## 2014-03-21 DIAGNOSIS — D125 Benign neoplasm of sigmoid colon: Secondary | ICD-10-CM

## 2014-03-21 DIAGNOSIS — D120 Benign neoplasm of cecum: Secondary | ICD-10-CM

## 2014-03-21 MED ORDER — SODIUM CHLORIDE 0.9 % IV SOLN: 500.0000 mL | INTRAVENOUS | Status: DC

## 2014-03-21 NOTE — Progress Notes (Signed)
Called to room to assist during endoscopic procedure.  Patient ID and intended procedure confirmed with present staff. Received instructions for my participation in the procedure from the performing physician.  

## 2014-03-21 NOTE — Progress Notes (Signed)
Procedure ends, to recovery, report given and VSS. 

## 2014-03-21 NOTE — Patient Instructions (Signed)
YOU HAD AN ENDOSCOPIC PROCEDURE TODAY AT THE [facility]: Refer to the procedure report that was given to you for any specific questions about what was found during the examination.  If the procedure report does not answer your questions, please call your gastroenterologist to clarify.  If you requested that your care partner not be given the details of your procedure findings, then the procedure report has been included in a sealed envelope for you to review at your convenience later.  YOU SHOULD EXPECT: Some feelings of bloating in the abdomen. Passage of more gas than usual.  Walking can help get rid of the air that was put into your GI tract during the procedure and reduce the bloating. If you had a lower endoscopy (such as a colonoscopy or flexible sigmoidoscopy) you may notice spotting of blood in your stool or on the toilet paper. If you underwent a bowel prep for your procedure, then you may not have a normal bowel movement for a few days.  DIET: Your first meal following the procedure should be a light meal and then it is ok to progress to your normal diet.  A half-sandwich or bowl of soup is an example of a good first meal.  Heavy or fried foods are harder to digest and may make you feel nauseous or bloated.  Likewise meals heavy in dairy and vegetables can cause extra gas to form and this can also increase the bloating.  Drink plenty of fluids but you should avoid alcoholic beverages for 24 hours.  ACTIVITY: Your care partner should take you home directly after the procedure.  You should plan to take it easy, moving slowly for the rest of the day.  You can resume normal activity the day after the procedure however you should NOT DRIVE or use heavy machinery for 24 hours (because of the sedation medicines used during the test).    SYMPTOMS TO REPORT IMMEDIATELY: A gastroenterologist can be reached at any hour.  During normal business hours, 8:30 AM to 5:00 PM Monday through Friday,  call [phone].  After hours and on weekends, please call the GI answering service at [phone] who will take a message and have the physician on call contact you.   Following lower endoscopy (colonoscopy or flexible sigmoidoscopy):  Excessive amounts of blood in the stool  Significant tenderness or worsening of abdominal pains  Swelling of the abdomen that is new, acute  Fever of 100F or higher  FOLLOW UP: If any biopsies were taken you will be contacted by phone or by letter within the next 1-3 weeks.  Call your gastroenterologist if you have not heard about the biopsies in 3 weeks.  Our staff will call the home number listed on your records the next business day following your procedure to check on you and address any questions or concerns that you may have at that time regarding the information given to you following your procedure. This is a courtesy call and so if there is no answer at the home number and we have not heard from you through the emergency physician on call, we will assume that you have returned to your regular daily activities without incident.  SIGNATURES/CONFIDENTIALITY: You and/or your care partner have signed paperwork which will be entered into your electronic medical record.  These signatures attest to the fact that that the information above on your After Visit Summary has been reviewed and is understood.  Full responsibility of the confidentiality of this   discharge information lies with you and/or your care-partner.   Information on polyps & diverticulosis given to you today    

## 2014-03-21 NOTE — Op Note (Signed)
[facility]  [address] [place], [id number]   COLONOSCOPY PROCEDURE REPORT  PATIENT: Jason Sloan, Jason Sloan  MR#: [id number] BIRTHDATE: 09/27/1955 , 8  yrs. old GENDER: male ENDOSCOPIST: Milus Banister, MD REFERRED QH:UTML Redmond Pulling, MD PROCEDURE DATE:  03/21/2014 PROCEDURE:   Colonoscopy with snare polypectomy First Screening Colonoscopy - Avg.  risk and is 50 yrs.  old or older Yes.  Prior Negative Screening - Now for repeat screening. N/A  History of Adenoma - Now for follow-up colonoscopy & has been > or = to 3 yrs.  N/A  Polyps Removed Today? Yes. ASA CLASS:   Class II INDICATIONS:average risk patient for colon cancer. MEDICATIONS: Monitored anesthesia care and Propofol 300 mg IV  DESCRIPTION OF PROCEDURE:   After the risks benefits and alternatives of the procedure were thoroughly explained, informed consent was obtained.  The digital rectal exam revealed no abnormalities of the rectum.   The LB CF-[id number] Loaner [id number] endoscope was introduced through the anus and advanced to the cecum, which was identified by both the appendix and ileocecal valve. No adverse events experienced.   The quality of the prep was excellent.  The instrument was then slowly withdrawn as the colon was fully examined.   COLON FINDINGS: Two polyps were found, removed and sent to pathology.  These were both sessile, 4-28mm across, located in cecum and sigmoid segments, removed with cold snare.  There were numerous diverticulum in the left colon.  The examination was otherwise normal.  Retroflexed views revealed no abnormalities. The time to cecum=2 minutes 40 seconds.  Withdrawal time=11 minutes 25 seconds. The scope was withdrawn and the procedure completed. COMPLICATIONS: There were no immediate complications.  ENDOSCOPIC IMPRESSION: Two polyps were found, removed and sent to pathology. There were numerous diverticulum in the left colon. The examination was otherwise  normal  RECOMMENDATIONS: If the polyp(s) removed today are proven to be adenomatous (pre-cancerous) polyps, you will need a repeat colonoscopy in 5 years.  Otherwise you should continue to follow colorectal cancer screening guidelines for "routine risk" patients with colonoscopy in 10 years.  You will receive a letter within 1-2 weeks with the results of your biopsy as well as final recommendations.  Please call my office if you have not received a letter after 3 weeks.  eSigned:  Milus Banister, MD 03/21/2014 3:09 PM

## 2014-03-24 ENCOUNTER — Telehealth: Payer: Self-pay | Admitting: *Deleted

## 2014-03-24 NOTE — Telephone Encounter (Signed)
  Follow up Call-  Call back number 03/21/2014 01/24/2014  Post procedure Call Back phone  # [phone]  Permission to leave phone message Yes Yes     Patient questions:  Do you have a fever, pain , or abdominal swelling? No. Pain Score  0 *  Have you tolerated food without any problems? Yes.    Have you been able to return to your normal activities? Yes.    Do you have any questions about your discharge instructions: Diet   No. Medications  No. Follow up visit  No.  Do you have questions or concerns about your Care? No.  Actions: * If pain score is 4 or above: No action needed, pain <4.

## 2014-03-28 ENCOUNTER — Encounter: Payer: Self-pay | Admitting: Gastroenterology

## 2014-08-17 ENCOUNTER — Encounter (HOSPITAL_BASED_OUTPATIENT_CLINIC_OR_DEPARTMENT_OTHER): Payer: Self-pay | Admitting: *Deleted

## 2014-08-17 ENCOUNTER — Emergency Department (HOSPITAL_BASED_OUTPATIENT_CLINIC_OR_DEPARTMENT_OTHER)
Admission: EM | Admit: 2014-08-17 | Discharge: 2014-08-17 | Disposition: A | Discharge status: Home / Self Care | Payer: BC Managed Care – PPO | Attending: Emergency Medicine | Admitting: Emergency Medicine

## 2014-08-17 DIAGNOSIS — Z79899 Other long term (current) drug therapy: Secondary | ICD-10-CM | POA: Diagnosis not present

## 2014-08-17 DIAGNOSIS — N508 Other specified disorders of male genital organs: Secondary | ICD-10-CM | POA: Diagnosis not present

## 2014-08-17 DIAGNOSIS — Z880 Allergy status to penicillin: Secondary | ICD-10-CM | POA: Diagnosis not present

## 2014-08-17 DIAGNOSIS — Q6100 Congenital renal cyst, unspecified: Secondary | ICD-10-CM | POA: Diagnosis not present

## 2014-08-17 DIAGNOSIS — M545 Low back pain: Secondary | ICD-10-CM

## 2014-08-17 DIAGNOSIS — Z8719 Personal history of other diseases of the digestive system: Secondary | ICD-10-CM | POA: Insufficient documentation

## 2014-08-17 MED ORDER — HYDROCODONE-ACETAMINOPHEN 5-325 MG PO TABS
1.0000 | ORAL_TABLET | Freq: Once | ORAL | Status: AC
  Administered 2014-08-17: 1 via ORAL
  Filled 2014-08-17: qty 1

## 2014-08-17 MED ORDER — IBUPROFEN 800 MG PO TABS
800.0000 mg | ORAL_TABLET | Freq: Once | ORAL | Status: AC
  Administered 2014-08-17: 800 mg via ORAL
  Filled 2014-08-17: qty 1

## 2014-08-17 MED ORDER — HYDROCODONE-ACETAMINOPHEN 5-325 MG PO TABS: 2.0000 | ORAL_TABLET | Freq: Four times a day (QID) | ORAL | Status: DC | PRN

## 2014-08-17 NOTE — Discharge Instructions (Signed)
Back Injury Prevention You can take 4 Advil tablets 3 times daily as well as the pain medicine prescribed. Call Dr. Redmond Pulling if continuing to have significant pain in 3 or 4 days. Back injuries can be extremely painful and difficult to heal. After having one back injury, you are much more likely to experience another later on. It is important to learn how to avoid injuring or re-injuring your back. The following tips can help you to prevent a back injury. PHYSICAL FITNESS  Exercise regularly and try to develop good tone in your abdominal muscles. Your abdominal muscles provide a lot of the support needed by your back.  Do aerobic exercises (walking, jogging, biking, swimming) regularly.  Do exercises that increase balance and strength (tai chi, yoga) regularly. This can decrease your risk of falling and injuring your back.  Stretch before and after exercising.  Maintain a healthy weight. The more you weigh, the more stress is placed on your back. For every pound of weight, 10 times that amount of pressure is placed on the back. DIET  Talk to your caregiver about how much calcium and vitamin D you need per day. These nutrients help to prevent weakening of the bones (osteoporosis). Osteoporosis can cause broken (fractured) bones that lead to back pain.  Include good sources of calcium in your diet, such as dairy products, green, leafy vegetables, and products with calcium added (fortified).  Include good sources of vitamin D in your diet, such as milk and foods that are fortified with vitamin D.  Consider taking a nutritional supplement or a multivitamin if needed.  Stop smoking if you smoke. POSTURE  Sit and stand up straight. Avoid leaning forward when you sit or hunching over when you stand.  Choose chairs with good low back (lumbar) support.  If you work at a desk, sit close to your work so you do not need to lean over. Keep your chin tucked in. Keep your neck drawn back and elbows  bent at a right angle. Your arms should look like the letter "L."  Sit high and close to the steering wheel when you drive. Add a lumbar support to your car seat if needed.  Avoid sitting or standing in one position for too long. Take breaks to get up, stretch, and walk around at least once every hour. Take breaks if you are driving for long periods of time.  Sleep on your side with your knees slightly bent, or sleep on your back with a pillow under your knees. Do not sleep on your stomach. LIFTING, TWISTING, AND REACHING  Avoid heavy lifting, especially repetitive lifting. If you must do heavy lifting:  Stretch before lifting.  Work slowly.  Rest between lifts.  Use carts and dollies to move objects when possible.  Make several small trips instead of carrying 1 heavy load.  Ask for help when you need it.  Ask for help when moving big, awkward objects.  Follow these steps when lifting:  Stand with your feet shoulder-width apart.  Get as close to the object as you can. Do not try to pick up heavy objects that are far from your body.  Use handles or lifting straps if they are available.  Bend at your knees. Squat down, but keep your heels off the floor.  Keep your shoulders pulled back, your chin tucked in, and your back straight.  Lift the object slowly, tightening the muscles in your legs, abdomen, and buttocks. Keep the object as close to the  center of your body as possible.  When you put a load down, use these same guidelines in reverse.  Do not:  Lift the object above your waist.  Twist at the waist while lifting or carrying a load. Move your feet if you need to turn, not your waist.  Bend over without bending at your knees.  Avoid reaching over your head, across a table, or for an object on a high surface. OTHER TIPS  Avoid wet floors and keep sidewalks clear of ice to prevent falls.  Do not sleep on a mattress that is too soft or too hard.  Keep items that  are used frequently within easy reach.  Put heavier objects on shelves at waist level and lighter objects on lower or higher shelves.  Find ways to decrease your stress, such as exercise, massage, or relaxation techniques. Stress can build up in your muscles. Tense muscles are more vulnerable to injury.  Seek treatment for depression or anxiety if needed. These conditions can increase your risk of developing back pain. SEEK MEDICAL CARE IF:  You injure your back.  You have questions about diet, exercise, or other ways to prevent back injuries. MAKE SURE YOU:  Understand these instructions.  Will watch your condition.  Will get help right away if you are not doing well or get worse. Document Released: 03/10/2004 Document Revised: 04/25/2011 Document Reviewed: 03/14/2011 [facility] Patient Information 2015 [place], [place]. This information is not intended to replace advice given to you by your health care provider. Make sure you discuss any questions you have with your health care provider.

## 2014-08-17 NOTE — ED Notes (Signed)
MD at bedside. 

## 2014-08-17 NOTE — ED Notes (Signed)
Lower back pain x 2 days.  Pt called his PCP and was given a rx for flexril.  Pt took a vicodin this morning without relief.

## 2014-08-17 NOTE — ED Provider Notes (Signed)
CSN: [id number]     Arrival date & time 08/17/14  0865 History   First MD Initiated Contact with Patient 08/17/14 [phone]     Chief Complaint  Patient presents with  . Back Pain     (Consider location/radiation/quality/duration/timing/severity/associated sxs/prior Treatment) HPI Complains of low nonradiating back pain onset yesterday while vacuuming. Pain is worse with bending or changing positions improved with remaining still. Pain is sharp and severe. No loss of bladder or bowel control no fever no other associated symptoms. No trauma. He has been treated with Flexeril, prescribed for him over the telephone by his primary care physician and one of the relatives hydrocodone tablets, without relief. Past Medical History  Diagnosis Date  . Diverticulosis   . Fatty liver 01/15/14  . Gallbladder polyp   . Kidney cysts    Past Surgical History  Procedure Laterality Date  . Back surgery    . Knee surgery Right   . Lobectomy Right     due to cryptococcal pneumonia  . Tonsillectomy     Family History  Problem Relation Age of Onset  . Stomach cancer Father     ??  . Colon cancer Neg Hx   . Throat cancer Neg Hx   . Pancreatic cancer Neg Hx   . Diabetes Maternal Grandfather   . Kidney disease Neg Hx   . Liver disease Neg Hx   . Heart disease Neg Hx    History  Substance Use Topics  . Smoking status: Never Smoker   . Smokeless tobacco: Current User     Comment: occasional  . Alcohol Use: 0.0 oz/week    0 Standard drinks or equivalent per week     Comment: seldom    Review of Systems  Constitutional: Negative.   HENT: Negative.   Respiratory: Negative.   Cardiovascular: Negative.   Gastrointestinal: Negative.   Genitourinary: Positive for penile swelling.  Musculoskeletal: Negative.   Skin: Negative.   Neurological: Negative.   Psychiatric/Behavioral: Negative.   All other systems reviewed and are negative.     Allergies  Penicillins  Home Medications   Prior to  Admission medications   Medication Sig Start Date End Date Taking? Authorizing Provider  HYDROcodone-acetaminophen (NORCO) 5-325 MG per tablet Take 1-2 tablets by mouth every 6 (six) hours as needed. Patient not taking: Reported on 03/21/2014 01/15/14   Veryl Speak, MD  pantoprazole (PROTONIX) 40 MG tablet Take 1 tablet (40 mg total) by mouth daily. Patient not taking: Reported on 03/21/2014 01/20/14   Lori P Hvozdovic, PA-C   BP 107/68 mmHg  Pulse 88  Temp(Src) 98 F (36.7 C) (Oral)  Resp 18  SpO2 97% Physical Exam  Constitutional: He is oriented to person, place, and time. He appears well-developed and well-nourished.  HENT:  Head: Normocephalic and atraumatic.  Eyes: Conjunctivae are normal. Pupils are equal, round, and reactive to light.  Neck: Neck supple. No tracheal deviation present. No thyromegaly present.  Cardiovascular: Normal rate and regular rhythm.   No murmur heard. Pulmonary/Chest: Effort normal and breath sounds normal.  Abdominal: Soft. Bowel sounds are normal. He exhibits no distension. There is no tenderness.  Musculoskeletal: Normal range of motion. He exhibits no edema or tenderness.  No point tenderness along spine. He has pain at lumbar area upon sitting up from supine position.  Neurological: He is alert and oriented to person, place, and time. He has normal reflexes. No cranial nerve deficit. Coordination normal.  DTR symmetric bilaterally at knee jerk ankle jerk  and biceps toes downward going bilaterally. Gait normal  Skin: Skin is warm and dry. No rash noted.  Psychiatric: He has a normal mood and affect.  Nursing note and vitals reviewed.   ED Course  Procedures (including critical care time) Labs Review Labs Reviewed - No data to display  Imaging Review No results found.   EKG Interpretation None      MDM  Imaging not indicated. No red flexor back pain. Plan prescription Norco, he is also told he can take Advil 800 mg 3 times daily. Follow-up  Dr. Redmond Pulling if not better in a few days Final diagnoses:  None   Diagnosis lumbar strain     Orlie Dakin, MD 08/17/14 [phone]

## 2015-12-15 IMAGING — US US ABDOMEN COMPLETE
1 series · 13 of 25 positions shown · non-contrast
Comparison: None.

CLINICAL DATA: Acute onset of severe right upper quadrant abdominal
pain approximately 8-9 hr ago with rectal bleeding.

EXAM:
ULTRASOUND ABDOMEN COMPLETE

[Series 1: us abdomen complete · 0.25mm/px · 13 of 79 slices shown]
[im 1/79]
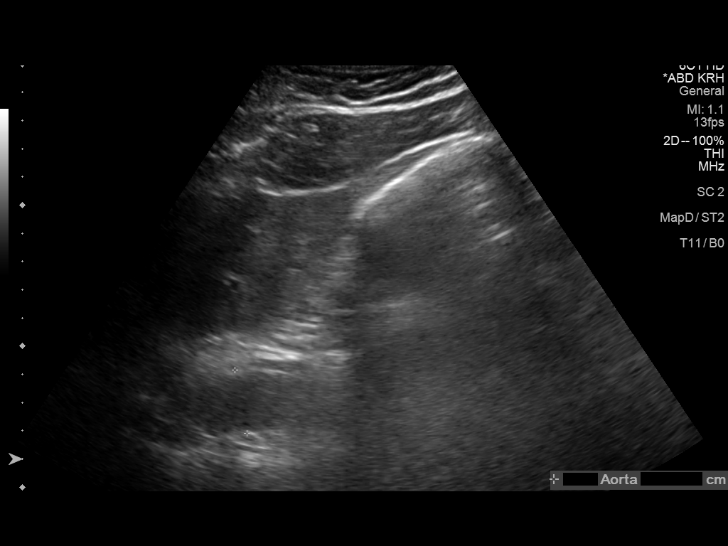
[im 7/79]
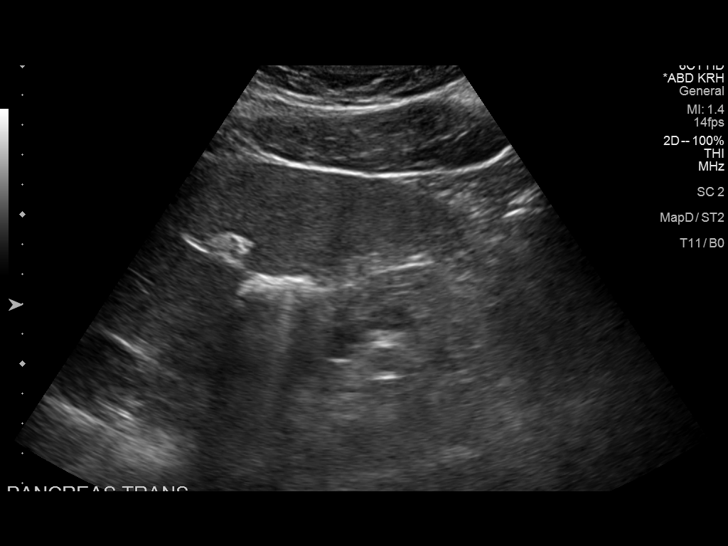
[im 14/79]
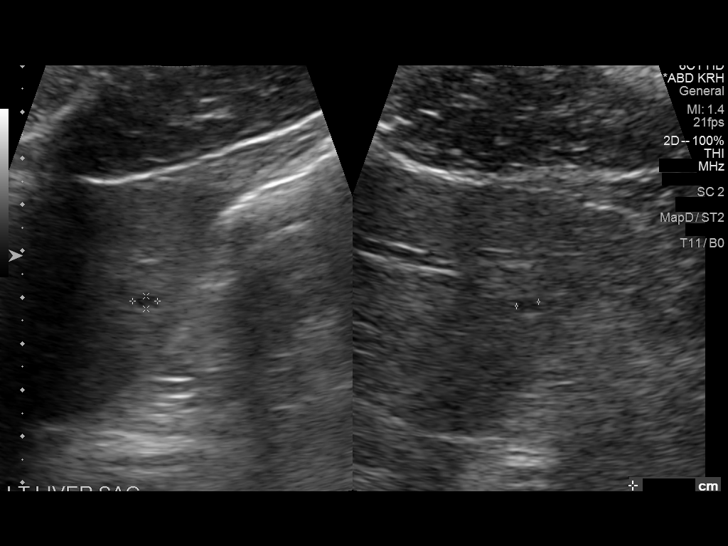
[im 20/79]
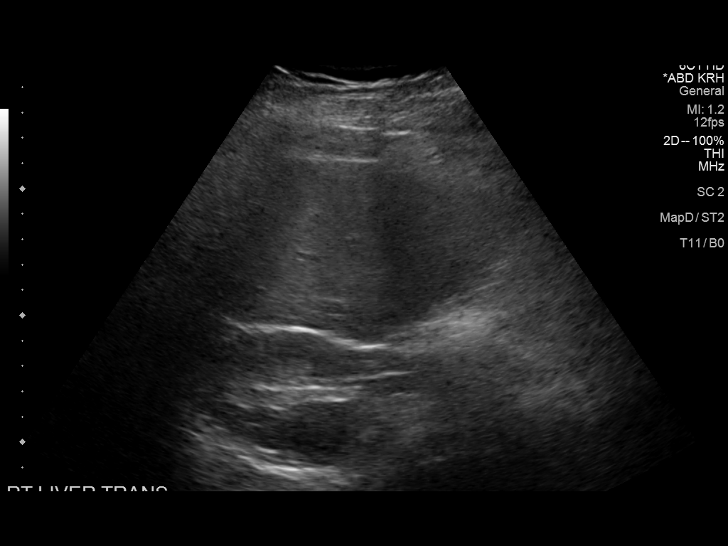
[im 27/79]
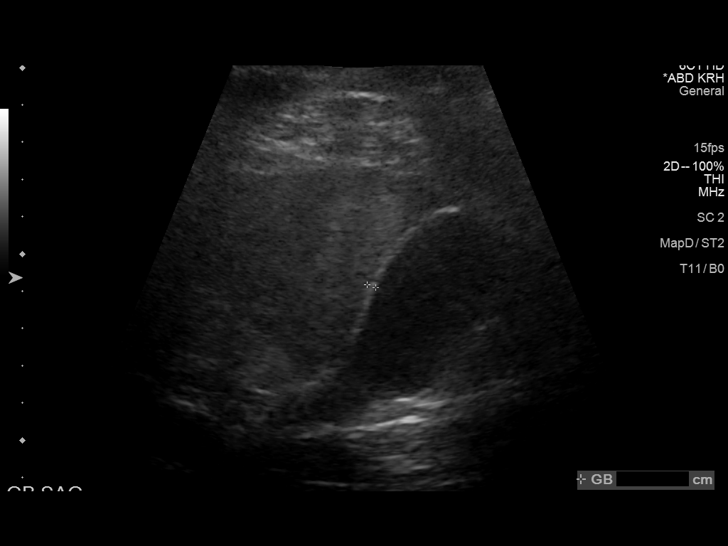
[im 33/79]
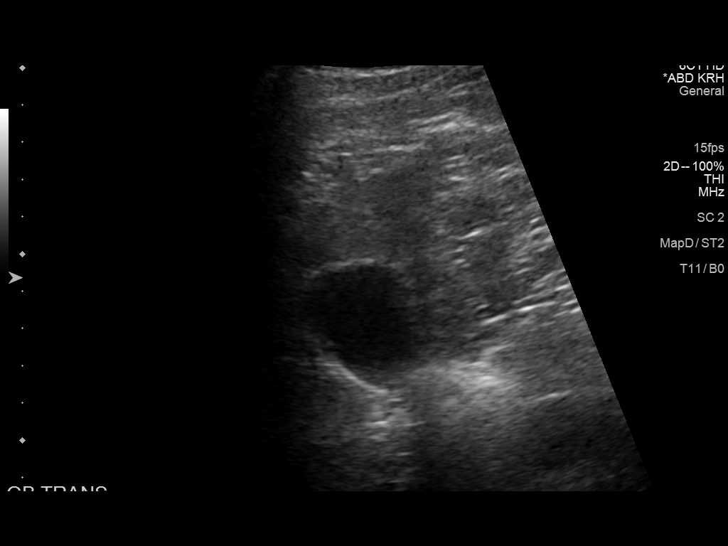
[im 40/79]
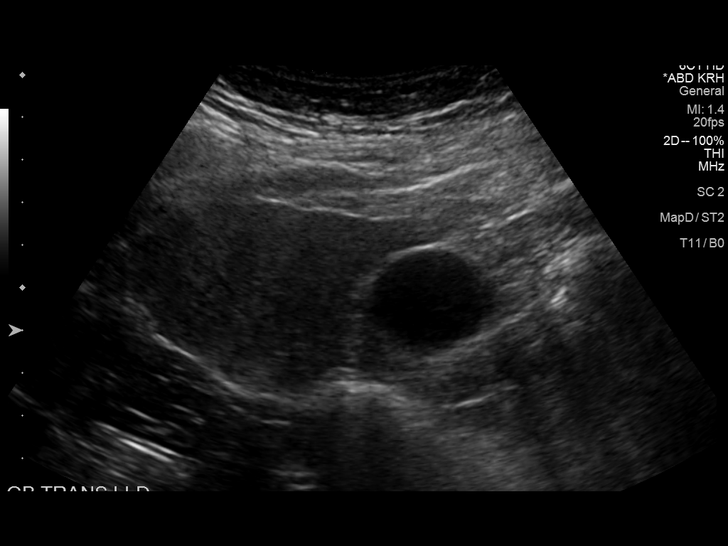
[im 46/79]
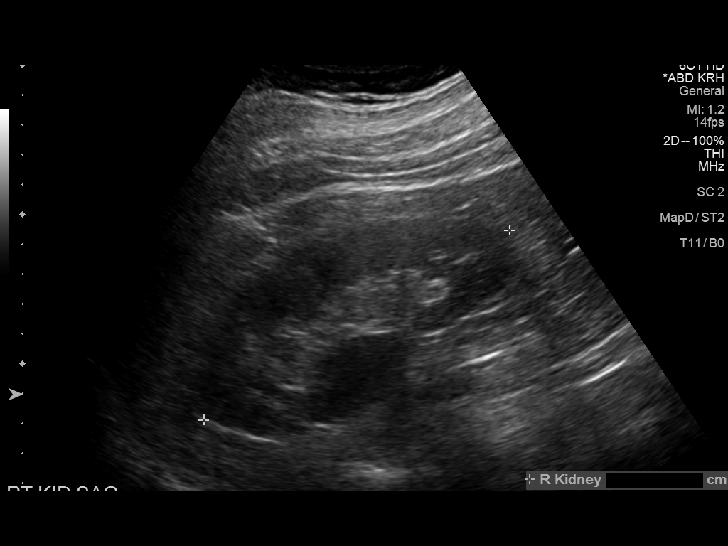
[im 53/79]
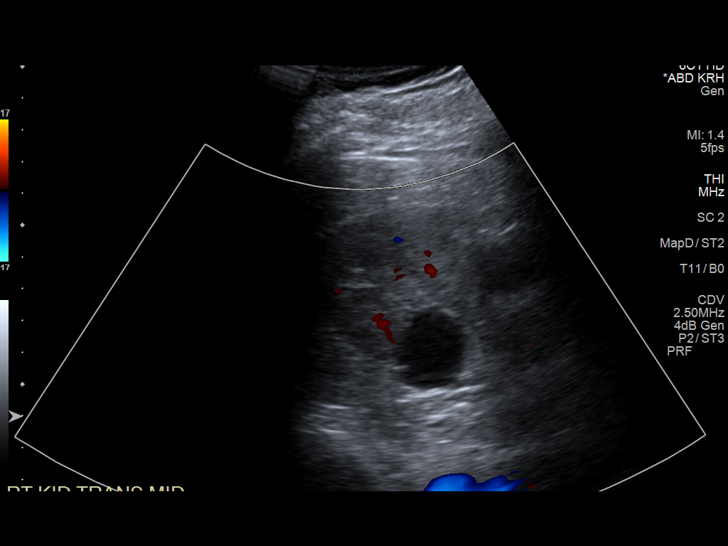
[im 59/79]
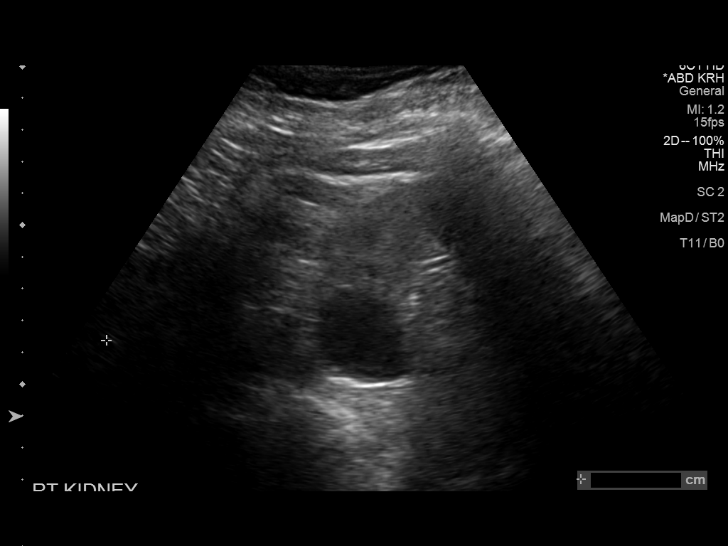
[im 66/79]
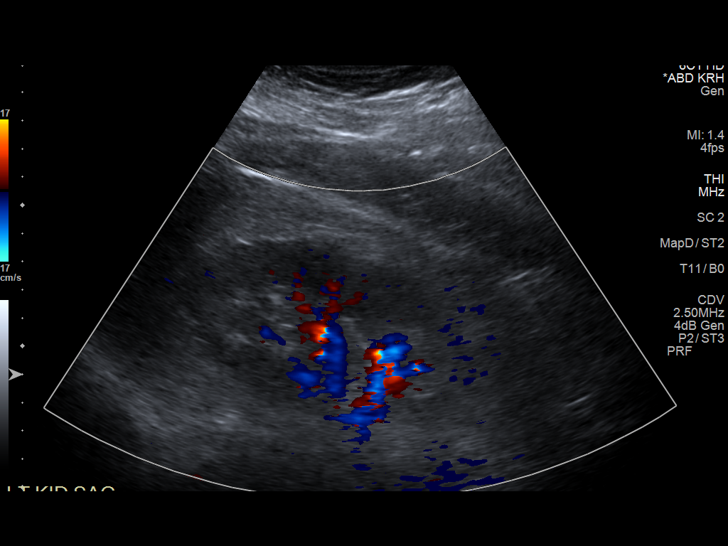
[im 72/79]
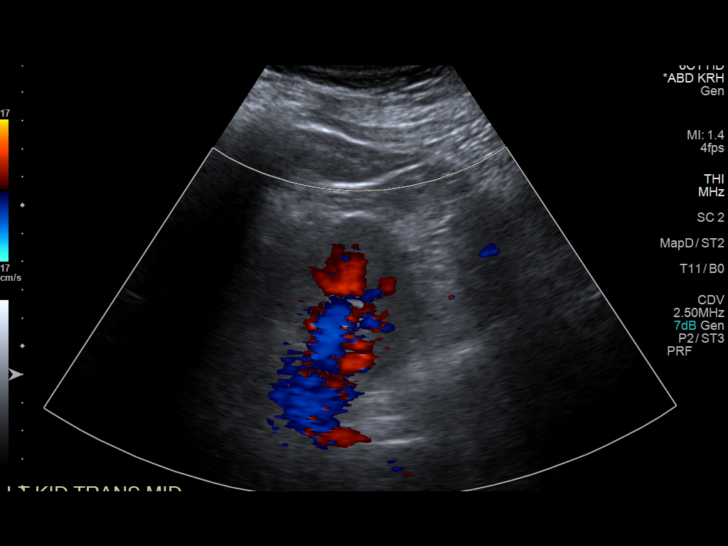
[im 79/79]
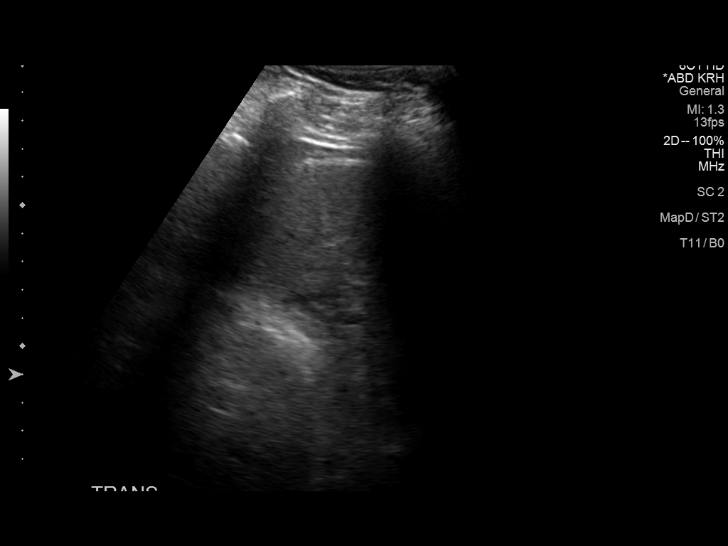

[13 of 25 positions shown; findings below may reference images not displayed]

FINDINGS: Gallbladder: A small 3 mm echogenic polyp is noted. There is a small
amount of layering echogenic sludge but no gallstones, wall
thickening or pericholecystic fluid.

Common bile duct: Diameter: 4 mm

Liver: There is diffuse increased echogenicity of the liver and
decreased through transmission consistent with fatty infiltration.
No focal lesions or biliary dilatation.

IVC: Normal caliber

Pancreas: Sonographically unremarkable

Spleen: Normal size and echogenicity without focal lesions

Right Kidney: Length: 11.6 cm. Normal renal cortical thickness and
echogenicity without focal lesions or hydronephrosis. A 3.3 x 2.7 x
2.5 cm cyst is noted.

Left Kidney: Length: 12.8 cm. Normal renal cortical thickness and
echogenicity without focal lesions or hydronephrosis. A 2.4 x 2.1 x
1.7 cm cyst is noted.

Abdominal aorta: Normal caliber

Other findings: None.
IMPRESSION: 1. Small echogenic gallbladder polyp and layering echogenic sludge
but no gallstones or findings for acute cholecystitis.
2. Normal caliber common bile duct.
3. Diffuse fatty infiltration of the liver.
4. Bilateral simple appearing renal cysts.

## 2016-08-26 ENCOUNTER — Emergency Department (HOSPITAL_COMMUNITY)
Admission: EM | Admit: 2016-08-26 | Discharge: 2016-08-26 | Disposition: A | Discharge status: Home / Self Care | Payer: Self-pay | Attending: Emergency Medicine | Admitting: Emergency Medicine

## 2016-08-26 ENCOUNTER — Emergency Department (HOSPITAL_COMMUNITY): Payer: Self-pay

## 2016-08-26 ENCOUNTER — Encounter (HOSPITAL_COMMUNITY): Payer: Self-pay | Admitting: Emergency Medicine

## 2016-08-26 DIAGNOSIS — Y9279 Other farm location as the place of occurrence of the external cause: Secondary | ICD-10-CM | POA: Insufficient documentation

## 2016-08-26 DIAGNOSIS — Y999 Unspecified external cause status: Secondary | ICD-10-CM | POA: Insufficient documentation

## 2016-08-26 DIAGNOSIS — X509XXA Other and unspecified overexertion or strenuous movements or postures, initial encounter: Secondary | ICD-10-CM | POA: Insufficient documentation

## 2016-08-26 DIAGNOSIS — Z79899 Other long term (current) drug therapy: Secondary | ICD-10-CM | POA: Insufficient documentation

## 2016-08-26 DIAGNOSIS — Y93H9 Activity, other involving exterior property and land maintenance, building and construction: Secondary | ICD-10-CM | POA: Insufficient documentation

## 2016-08-26 DIAGNOSIS — M7541 Impingement syndrome of right shoulder: Secondary | ICD-10-CM | POA: Insufficient documentation

## 2016-08-26 DIAGNOSIS — M5414 Radiculopathy, thoracic region: Secondary | ICD-10-CM | POA: Insufficient documentation

## 2016-08-26 MED ORDER — OXYCODONE-ACETAMINOPHEN 5-325 MG PO TABS
2.0000 | ORAL_TABLET | Freq: Once | ORAL | Status: AC
  Administered 2016-08-26: 2 via ORAL
  Filled 2016-08-26: qty 2

## 2016-08-26 MED ORDER — LIDOCAINE 5 % EX PTCH
1.0000 | MEDICATED_PATCH | Freq: Once | CUTANEOUS | Status: DC
  Administered 2016-08-26: 1 via TRANSDERMAL
  Filled 2016-08-26: qty 1

## 2016-08-26 MED ORDER — OXYCODONE-ACETAMINOPHEN 5-325 MG PO TABS: 2.0000 | ORAL_TABLET | ORAL | 0 refills | Status: DC | PRN

## 2016-08-26 MED ORDER — KETOROLAC TROMETHAMINE 60 MG/2ML IM SOLN
60.0000 mg | Freq: Once | INTRAMUSCULAR | Status: AC
  Administered 2016-08-26: 60 mg via INTRAMUSCULAR
  Filled 2016-08-26: qty 2

## 2016-08-26 MED ORDER — MELOXICAM 7.5 MG PO TABS
7.5000 mg | ORAL_TABLET | Freq: Every day | ORAL | 0 refills | Status: DC
Start: 2016-08-26 — End: 2020-11-09

## 2016-08-26 MED ORDER — LIDOCAINE 5 % EX PTCH: 1.0000 | MEDICATED_PATCH | CUTANEOUS | 0 refills | Status: DC

## 2016-08-26 NOTE — ED Triage Notes (Signed)
Pt c/o R shoulder pain radiating to RUE, states, "It feels raw", reports numbness ad tingling, loss of sensation to underside of arm.  Pt reports ongoing back and shoulder pain since July 4, reports being seen and treated at [facility], reports pain worse. Pt guarding, moaning.

## 2016-08-26 NOTE — ED Notes (Signed)
Pt notified NT that pain is still 10/10. This RN to bedside, updated patient on delay for lidocaine patch from pharmacy.

## 2016-08-26 NOTE — ED Notes (Signed)
Patient transported to X-ray 

## 2016-08-26 NOTE — ED Provider Notes (Signed)
[facility] DEPT Provider Note   CSN: [id number] Arrival date & time: 08/26/16  2458     History   Chief Complaint Chief Complaint  Patient presents with  . Shoulder Pain    HPI Jason Sloan is a 61 y.o. male.  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 By the time he started the weed whipping, he started noting some pain in the right shoulder. Over the course of the ensuing 3 days the pain started to become very severe with burning quality and decreasing range of motion. He was seen at [facility] that Saturday. He reports he was started on prednisone and muscle relaxers. He reports he has gotten no relief and the symptoms have gotten much worse. He has an area of intense burning pain between his thoracic spine and shoulder blade on the right. He reports he also has pain that comes underneath his armpit and radiates partly down the inner surface of the arm that has an intense burning, raw quality to it. He reports there are very few positions of comfort that he can find to place the arm. Past Medical History:  Diagnosis Date  . Diverticulosis   . Fatty liver 01/15/14  . Gallbladder polyp   . Kidney cysts     Patient Active Problem List   Diagnosis Date Noted  . RUQ abdominal pain 01/20/2014  . Rectal bleeding 01/20/2014    Past Surgical History:  Procedure Laterality Date  . BACK SURGERY     lower back  . KNEE SURGERY Right   . LOBECTOMY Right    due to cryptococcal pneumonia  . TONSILLECTOMY         Home Medications    Prior to Admission medications   Medication Sig Start Date End Date Taking? Authorizing Provider  cyclobenzaprine (FLEXERIL) 5 MG tablet Take 5 mg by mouth 3 (three) times daily as needed for muscle spasms.   Yes [provider]  HYDROcodone-acetaminophen (NORCO) 5-325 MG per tablet Take 2 tablets by mouth every 6 (six) hours as needed. Patient not taking:  Reported on 08/26/2016 08/17/14   Orlie Dakin, MD  lidocaine (LIDODERM) 5 % Place 1 patch onto the skin daily. Remove & Discard patch within 12 hours or as directed by MD 08/26/16   Charlesetta Shanks, MD  meloxicam (MOBIC) 7.5 MG tablet Take 1 tablet (7.5 mg total) by mouth daily. 08/26/16   Charlesetta Shanks, MD  oxyCODONE-acetaminophen (PERCOCET) 5-325 MG tablet Take 2 tablets by mouth every 4 (four) hours as needed. 08/26/16   Charlesetta Shanks, MD  pantoprazole (PROTONIX) 40 MG tablet Take 1 tablet (40 mg total) by mouth daily. Patient not taking: Reported on 03/21/2014 01/20/14   Hvozdovic, Vita Barley, PA-C    Family History Family History  Problem Relation Age of Onset  . Stomach cancer Father        ??  . Diabetes Maternal Grandfather   . Colon cancer Neg Hx   . Throat cancer Neg Hx   . Pancreatic cancer Neg Hx   . Kidney disease Neg Hx   . Liver disease Neg Hx   . Heart disease Neg Hx     Social History Social History  Substance Use Topics  . Smoking status: Never Smoker  . Smokeless tobacco: Current User     Comment: occasional  . Alcohol use 0.0 oz/week     Comment: seldom  Allergies   Penicillins   Review of Systems Review of Systems 10 Systems reviewed and are negative for acute change except as noted in the HPI.  Physical Exam Updated Vital Signs BP 133/76   Pulse (!) 43   Temp 97.6 F (36.4 C) (Oral)   Resp 18   Ht 6\' 4"  (1.93 m)   Wt 104.3 kg (230 lb)   SpO2 98%   BMI 28.00 kg/m   Physical Exam  Constitutional: He is oriented to person, place, and time. He appears well-developed and well-nourished.  Patient is alert and nontoxic. He has significant pain with movement of the shoulder.  HENT:  Head: Normocephalic and atraumatic.  Eyes: Conjunctivae and EOM are normal.  Neck: Neck supple.  Cardiovascular: Normal rate and regular rhythm.   No murmur heard. Pulmonary/Chest: Effort normal and breath sounds normal. No respiratory distress.  Patient has very  reproducible pain in the thoracic paraspinous muscle bodies between thoracic spine and shoulder blade on the right. This is exquisitely tender.  Abdominal: Soft. There is no tenderness.  Musculoskeletal: He exhibits no edema.  Pain with rotation of the shoulder. Maximum forward flexion 45. Shoulder contour normal. Patient is neurovascularly intact.  Neurological: He is alert and oriented to person, place, and time. He exhibits normal muscle tone. Coordination normal.  Skin: Skin is warm and dry.  Psychiatric: He has a normal mood and affect.  Nursing note and vitals reviewed.    ED Treatments / Results  Labs (all labs ordered are listed, but only abnormal results are displayed) Labs Reviewed - No data to display  EKG  EKG Interpretation None       Radiology Dg Thoracic Spine 2 View  Result Date: 08/26/2016 CLINICAL DATA:  Acute thoracic spine pain from injury 9 days ago at EXAM: THORACIC SPINE 2 VIEWS COMPARISON:  None. FINDINGS: No fracture or spondylolisthesis is noted. Mild anterior osteophyte formation is noted in the midthoracic spine. Disc spaces are well-maintained. IMPRESSION: Mild degenerative changes as described above. No acute abnormality seen in the thoracic spine. Electronically Signed   By: Marijo Conception, M.D.   On: 08/26/2016 11:39   Dg Shoulder Right  Result Date: 08/26/2016 CLINICAL DATA:  Acute right shoulder pain after injury 9 days ago. EXAM: RIGHT SHOULDER - 2+ VIEW COMPARISON:  None. FINDINGS: There is no evidence of fracture or dislocation. Mild degenerative changes seen involving the right acromioclavicular joint. Soft tissues are unremarkable. 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 No acute abnormality seen in the right shoulder. Electronically Signed   By: Marijo Conception, M.D.   On: 08/26/2016 11:37    Procedures Procedures (including critical care time)  Medications Ordered in ED Medications  lidocaine  (LIDODERM) 5 % 1 patch (1 patch Transdermal Patch Applied 08/26/16 1135)  oxyCODONE-acetaminophen (PERCOCET/ROXICET) 5-325 MG per tablet 2 tablet (2 tablets Oral Given 08/26/16 1031)  ketorolac (TORADOL) injection 60 mg (60 mg Intramuscular Given 08/26/16 1033)     Initial Impression / Assessment and Plan / ED Course  I have reviewed the triage vital signs and the nursing notes.  Pertinent labs & imaging results that were available during my care of the patient were reviewed by me and considered in my medical decision making (see chart for details).      Final Clinical Impressions(s) / ED Diagnoses   Final diagnoses:  Shoulder impingement syndrome, right  Thoracic nerve root impingement  Patient had extensive lifting and pulling episode of precipitated  current pain syndrome. He has neuropathic pain suggestive of impingement in the thoracic region. This pain is reproducible with severe pain in the parathoracic muscle bodies. He also has extensive amount of pain with rotation of the shoulder joint itself. There is however no neurovascular compromise. Patient will be treated for pain with Percocet and meloxicam. He reports he's had no improvement with 5 days of prednisone. This will be discontinued. Also use lidocaine patches and follow-up with [facility] as soon as possible.  New Prescriptions New Prescriptions   LIDOCAINE (LIDODERM) 5 %    Place 1 patch onto the skin daily. Remove & Discard patch within 12 hours or as directed by MD   MELOXICAM (MOBIC) 7.5 MG TABLET    Take 1 tablet (7.5 mg total) by mouth daily.   OXYCODONE-ACETAMINOPHEN (PERCOCET) 5-325 MG TABLET    Take 2 tablets by mouth every 4 (four) hours as needed.     Charlesetta Shanks, MD 08/26/16 [phone]

## 2016-08-31 ENCOUNTER — Other Ambulatory Visit: Payer: Self-pay | Admitting: Orthopedic Surgery

## 2016-08-31 DIAGNOSIS — M542 Cervicalgia: Secondary | ICD-10-CM

## 2016-09-02 ENCOUNTER — Ambulatory Visit
Admission: RE | Admit: 2016-09-02 | Discharge: 2016-09-02 | Disposition: A | Discharge status: Home / Self Care | Payer: No Typology Code available for payment source | Source: Ambulatory Visit | Attending: Orthopedic Surgery | Admitting: Orthopedic Surgery

## 2016-09-02 DIAGNOSIS — M542 Cervicalgia: Secondary | ICD-10-CM

## 2016-09-12 ENCOUNTER — Other Ambulatory Visit: Payer: BC Managed Care – PPO

## 2017-05-10 ENCOUNTER — Emergency Department (HOSPITAL_COMMUNITY)
Admission: EM | Admit: 2017-05-10 | Discharge: 2017-05-10 | Disposition: A | Discharge status: Home / Self Care | Payer: Self-pay | Attending: Emergency Medicine | Admitting: Emergency Medicine

## 2017-05-10 ENCOUNTER — Other Ambulatory Visit: Payer: Self-pay

## 2017-05-10 ENCOUNTER — Encounter (HOSPITAL_COMMUNITY): Payer: Self-pay | Admitting: Emergency Medicine

## 2017-05-10 DIAGNOSIS — Z5321 Procedure and treatment not carried out due to patient leaving prior to being seen by health care provider: Secondary | ICD-10-CM | POA: Insufficient documentation

## 2017-05-10 DIAGNOSIS — M79605 Pain in left leg: Secondary | ICD-10-CM | POA: Insufficient documentation

## 2017-05-10 LAB — BASIC METABOLIC PANEL
Anion gap: 10 (ref 5–15)
BUN: 15 mg/dL (ref 6–20)
CALCIUM: 9 mg/dL (ref 8.9–10.3)
CO2: 26 mmol/L (ref 22–32)
Chloride: 102 mmol/L (ref 101–111)
Creatinine, Ser: 0.93 mg/dL (ref 0.61–1.24)
GFR calc Af Amer: >60 mL/min (ref >60)
GFR calc non Af Amer: >60 mL/min (ref >60)
Glucose, Bld: 120 mg/dL — ABNORMAL HIGH (ref 65–99)
POTASSIUM: 3.8 mmol/L (ref 3.5–5.1)
Sodium: 138 mmol/L (ref 135–145)

## 2017-05-10 LAB — CBC
HCT: 46.2 % (ref 39.0–52.0)
Hemoglobin: 15.8 g/dL (ref 13.0–17.0)
MCH: 31.4 pg (ref 26.0–34.0)
MCHC: 34.2 g/dL (ref 30.0–36.0)
MCV: 91.8 fL (ref 78.0–100.0)
Platelets: 235 10*3/uL (ref 150–400)
RBC: 5.03 MIL/uL (ref 4.22–5.81)
RDW: 13.9 % (ref 11.5–15.5)
WBC: 7.3 10*3/uL (ref 4.0–10.5)

## 2017-05-10 LAB — D-DIMER, QUANTITATIVE (NOT AT ARMC): D-Dimer, Quant: 0.63 ug/mL-FEU — ABNORMAL HIGH (ref 0.00–0.50)

## 2017-05-10 NOTE — ED Notes (Signed)
Pt states that he does not want to stay due to wait times, advised pt to stay and see a doctor, pt states he will return tomorrow when a DVT study can be performed since he knows they are only here 8-5

## 2017-05-10 NOTE — ED Triage Notes (Signed)
Pt reports L leg pain X1 week, behind knee that radiates down calf. Pt went to [facility], had xray done that was unremarkable. Pt was placed in leg immobilizer and sent here for DVT R/O

## 2017-05-11 ENCOUNTER — Other Ambulatory Visit (HOSPITAL_COMMUNITY): Payer: Self-pay | Admitting: Sports Medicine

## 2017-05-11 ENCOUNTER — Ambulatory Visit (HOSPITAL_COMMUNITY)
Admission: RE | Admit: 2017-05-11 | Discharge: 2017-05-11 | Disposition: A | Discharge status: Home / Self Care | Payer: Self-pay | Source: Ambulatory Visit | Attending: Sports Medicine | Admitting: Sports Medicine

## 2017-05-11 DIAGNOSIS — M79605 Pain in left leg: Secondary | ICD-10-CM

## 2017-05-11 DIAGNOSIS — M7989 Other specified soft tissue disorders: Principal | ICD-10-CM

## 2017-05-11 NOTE — Progress Notes (Signed)
Left lower extremity venous duplex has been completed. Negative for DVT. Results were given to Dr. Layne Benton.  05/11/17 10:27 AM Jason Sloan RVT

## 2017-05-18 ENCOUNTER — Encounter (HOSPITAL_BASED_OUTPATIENT_CLINIC_OR_DEPARTMENT_OTHER): Payer: Self-pay | Admitting: *Deleted

## 2017-05-22 ENCOUNTER — Ambulatory Visit: Payer: Self-pay | Admitting: Physician Assistant

## 2017-05-22 NOTE — H&P (Signed)
Jason Sloan is an 62 y.o. male.   Chief Complaint: left knee pain HPI: Brax Walen has a well-defined meniscus tear with mechanical symptoms from an acute event; some mild arthritis, but overall in an immobilizer, and significant mechanical symptoms are present.    Past Medical History:  Diagnosis Date  . DVT (deep venous thrombosis) ([place])     Past Surgical History:  Procedure Laterality Date  . BACK SURGERY     lower back  . KNEE SURGERY Right   . LOBECTOMY Right    due to cryptococcal pneumonia  . TONSILLECTOMY      Family History  Problem Relation Age of Onset  . Stomach cancer Father        ??  . Diabetes Maternal Grandfather   . Colon cancer Neg Hx   . Throat cancer Neg Hx   . Pancreatic cancer Neg Hx   . Kidney disease Neg Hx   . Liver disease Neg Hx   . Heart disease Neg Hx    Social History:  reports that he has never smoked. He uses smokeless tobacco. He reports that he drinks alcohol. He reports that he does not use drugs.  Allergies:  Allergies  Allergen Reactions  . Penicillins     Happened as a child and doesn't remember     (Not in a hospital admission)  No results found for this or any previous visit (from the past 43 hour(s)). No results found.  Review of Systems  Musculoskeletal: Positive for joint pain.  All other systems reviewed and are negative.   There were no vitals taken for this visit. Physical Exam  Constitutional: He is oriented to person, place, and time. He appears well-developed and well-nourished. No distress.  HENT:  Head: Normocephalic and atraumatic.  Eyes: Pupils are equal, round, and reactive to light. Conjunctivae and EOM are normal.  Neck: Normal range of motion. Neck supple.  Cardiovascular: Normal rate and intact distal pulses.  Respiratory: Effort normal. No respiratory distress.  GI: Soft. He exhibits no distension. There is no tenderness.  Musculoskeletal:       Left knee: He exhibits swelling. Tenderness  found. Medial joint line tenderness noted.  Neurological: He is alert and oriented to person, place, and time.  Skin: Skin is warm and dry. No rash noted. No erythema.  Psychiatric: He has a normal mood and affect. His behavior is normal.     Assessment/Plan  Left knee medial meniscus tear   Unfortunately he doesn't have problems, but he has significant enough problems to merit surgery - arthroscopic meniscectomy, and we will work with him here, on a business plan so to speak.  Potentially consideration to doing him as soon as practical based on the significant problems he is having with his knee mechanically.  Follow up pending.  He does not want narcotics, and will give him tramadol postoperatively with antiinflammatory which Dr. Layne Benton has given him before.   Chriss Czar, PA-C 05/22/2017, 12:45 PM

## 2017-05-22 NOTE — H&P (View-Only) (Signed)
Jason Sloan is an 62 y.o. male.   Chief Complaint: left knee pain HPI: Jason Sloan has a well-defined meniscus tear with mechanical symptoms from an acute event; some mild arthritis, but overall in an immobilizer, and significant mechanical symptoms are present.    Past Medical History:  Diagnosis Date  . DVT (deep venous thrombosis) ([place])     Past Surgical History:  Procedure Laterality Date  . BACK SURGERY     lower back  . KNEE SURGERY Right   . LOBECTOMY Right    due to cryptococcal pneumonia  . TONSILLECTOMY      Family History  Problem Relation Age of Onset  . Stomach cancer Father        ??  . Diabetes Maternal Grandfather   . Colon cancer Neg Hx   . Throat cancer Neg Hx   . Pancreatic cancer Neg Hx   . Kidney disease Neg Hx   . Liver disease Neg Hx   . Heart disease Neg Hx    Social History:  reports that he has never smoked. He uses smokeless tobacco. He reports that he drinks alcohol. He reports that he does not use drugs.  Allergies:  Allergies  Allergen Reactions  . Penicillins     Happened as a child and doesn't remember     (Not in a hospital admission)  No results found for this or any previous visit (from the past 18 hour(s)). No results found.  Review of Systems  Musculoskeletal: Positive for joint pain.  All other systems reviewed and are negative.   There were no vitals taken for this visit. Physical Exam  Constitutional: He is oriented to person, place, and time. He appears well-developed and well-nourished. No distress.  HENT:  Head: Normocephalic and atraumatic.  Eyes: Pupils are equal, round, and reactive to light. Conjunctivae and EOM are normal.  Neck: Normal range of motion. Neck supple.  Cardiovascular: Normal rate and intact distal pulses.  Respiratory: Effort normal. No respiratory distress.  GI: Soft. He exhibits no distension. There is no tenderness.  Musculoskeletal:       Left knee: He exhibits swelling. Tenderness  found. Medial joint line tenderness noted.  Neurological: He is alert and oriented to person, place, and time.  Skin: Skin is warm and dry. No rash noted. No erythema.  Psychiatric: He has a normal mood and affect. His behavior is normal.     Assessment/Plan  Left knee medial meniscus tear   Unfortunately he doesn't have problems, but he has significant enough problems to merit surgery - arthroscopic meniscectomy, and we will work with him here, on a business plan so to speak.  Potentially consideration to doing him as soon as practical based on the significant problems he is having with his knee mechanically.  Follow up pending.  He does not want narcotics, and will give him tramadol postoperatively with antiinflammatory which Dr. Layne Benton has given him before.   Chriss Czar, PA-C 05/22/2017, 12:45 PM

## 2017-05-24 ENCOUNTER — Ambulatory Visit (HOSPITAL_BASED_OUTPATIENT_CLINIC_OR_DEPARTMENT_OTHER): Payer: Self-pay | Admitting: Anesthesiology

## 2017-05-24 ENCOUNTER — Ambulatory Visit (HOSPITAL_BASED_OUTPATIENT_CLINIC_OR_DEPARTMENT_OTHER)
Admission: RE | Admit: 2017-05-24 | Discharge: 2017-05-24 | Disposition: A | Discharge status: Home / Self Care | Payer: Self-pay | Source: Ambulatory Visit | Attending: Orthopedic Surgery | Admitting: Orthopedic Surgery

## 2017-05-24 ENCOUNTER — Encounter (HOSPITAL_BASED_OUTPATIENT_CLINIC_OR_DEPARTMENT_OTHER): Payer: Self-pay | Admitting: Anesthesiology

## 2017-05-24 ENCOUNTER — Other Ambulatory Visit: Payer: Self-pay

## 2017-05-24 ENCOUNTER — Encounter (HOSPITAL_BASED_OUTPATIENT_CLINIC_OR_DEPARTMENT_OTHER)
Admission: RE | Disposition: A | Discharge status: Home / Self Care | Payer: Self-pay | Source: Ambulatory Visit | Attending: Orthopedic Surgery

## 2017-05-24 DIAGNOSIS — Z86718 Personal history of other venous thrombosis and embolism: Secondary | ICD-10-CM | POA: Insufficient documentation

## 2017-05-24 DIAGNOSIS — X58XXXA Exposure to other specified factors, initial encounter: Secondary | ICD-10-CM | POA: Insufficient documentation

## 2017-05-24 DIAGNOSIS — I739 Peripheral vascular disease, unspecified: Secondary | ICD-10-CM | POA: Insufficient documentation

## 2017-05-24 DIAGNOSIS — S83232A Complex tear of medial meniscus, current injury, left knee, initial encounter: Secondary | ICD-10-CM | POA: Insufficient documentation

## 2017-05-24 DIAGNOSIS — F17290 Nicotine dependence, other tobacco product, uncomplicated: Secondary | ICD-10-CM | POA: Insufficient documentation

## 2017-05-24 DIAGNOSIS — M2242 Chondromalacia patellae, left knee: Secondary | ICD-10-CM | POA: Insufficient documentation

## 2017-05-24 HISTORY — DX: Acute embolism and thrombosis of unspecified deep veins of unspecified lower extremity: I82.409

## 2017-05-24 HISTORY — PX: KNEE ARTHROSCOPY WITH MEDIAL MENISECTOMY: SHX5651

## 2017-05-24 HISTORY — PX: CHONDROPLASTY: SHX5177

## 2017-05-24 SURGERY — ARTHROSCOPY, KNEE, WITH MEDIAL MENISCECTOMY
Anesthesia: General | Site: Knee | Laterality: Left

## 2017-05-24 MED ORDER — SCOPOLAMINE 1 MG/3DAYS TD PT72: 1.0000 | MEDICATED_PATCH | Freq: Once | TRANSDERMAL | Status: DC | PRN

## 2017-05-24 MED ORDER — HYDROMORPHONE HCL 1 MG/ML IJ SOLN
INTRAMUSCULAR | Status: AC
  Filled 2017-05-24: qty 0.5

## 2017-05-24 MED ORDER — BUPIVACAINE-EPINEPHRINE 0.5% -1:200000 IJ SOLN
INTRAMUSCULAR | Status: DC | PRN
  Administered 2017-05-24: 30 mL

## 2017-05-24 MED ORDER — ONDANSETRON HCL 4 MG/2ML IJ SOLN
INTRAMUSCULAR | Status: DC | PRN
  Administered 2017-05-24: 4 mg via INTRAVENOUS

## 2017-05-24 MED ORDER — HYDROCODONE-ACETAMINOPHEN 5-325 MG PO TABS: 1.0000 | ORAL_TABLET | ORAL | 0 refills | Status: AC | PRN

## 2017-05-24 MED ORDER — FENTANYL CITRATE (PF) 100 MCG/2ML IJ SOLN
50.0000 ug | INTRAMUSCULAR | Status: DC | PRN
  Administered 2017-05-24 (×2): 50 ug via INTRAVENOUS

## 2017-05-24 MED ORDER — METOCLOPRAMIDE HCL 5 MG PO TABS: 5.0000 mg | ORAL_TABLET | Freq: Three times a day (TID) | ORAL | Status: DC | PRN

## 2017-05-24 MED ORDER — HYDROCODONE-ACETAMINOPHEN 7.5-325 MG PO TABS: 1.0000 | ORAL_TABLET | Freq: Once | ORAL | Status: DC | PRN

## 2017-05-24 MED ORDER — LACTATED RINGERS IV SOLN
INTRAVENOUS | Status: DC
  Administered 2017-05-24: 13:00:00 via INTRAVENOUS

## 2017-05-24 MED ORDER — DEXAMETHASONE SODIUM PHOSPHATE 10 MG/ML IJ SOLN
INTRAMUSCULAR | Status: AC
  Filled 2017-05-24: qty 1

## 2017-05-24 MED ORDER — ONDANSETRON HCL 4 MG/2ML IJ SOLN: 4.0000 mg | Freq: Once | INTRAMUSCULAR | Status: DC | PRN

## 2017-05-24 MED ORDER — HYDROCODONE-ACETAMINOPHEN 5-325 MG PO TABS: 1.0000 | ORAL_TABLET | ORAL | Status: DC | PRN

## 2017-05-24 MED ORDER — METHYLPREDNISOLONE ACETATE 80 MG/ML IJ SUSP
INTRAMUSCULAR | Status: DC | PRN
  Administered 2017-05-24: 80 mg

## 2017-05-24 MED ORDER — DEXAMETHASONE SODIUM PHOSPHATE 10 MG/ML IJ SOLN
INTRAMUSCULAR | Status: DC | PRN
  Administered 2017-05-24: 10 mg via INTRAVENOUS

## 2017-05-24 MED ORDER — CLINDAMYCIN PHOSPHATE 900 MG/50ML IV SOLN
INTRAVENOUS | Status: AC
  Filled 2017-05-24: qty 50

## 2017-05-24 MED ORDER — MIDAZOLAM HCL 2 MG/2ML IJ SOLN
1.0000 mg | INTRAMUSCULAR | Status: DC | PRN
  Administered 2017-05-24: 2 mg via INTRAVENOUS

## 2017-05-24 MED ORDER — PROPOFOL 10 MG/ML IV BOLUS
INTRAVENOUS | Status: DC | PRN
  Administered 2017-05-24: 200 mg via INTRAVENOUS

## 2017-05-24 MED ORDER — CHLORHEXIDINE GLUCONATE 4 % EX LIQD: 60.0000 mL | Freq: Once | CUTANEOUS | Status: DC

## 2017-05-24 MED ORDER — DOCUSATE SODIUM 100 MG PO CAPS: 100.0000 mg | ORAL_CAPSULE | Freq: Two times a day (BID) | ORAL | Status: DC

## 2017-05-24 MED ORDER — HYDROMORPHONE HCL 1 MG/ML IJ SOLN
0.2500 mg | INTRAMUSCULAR | Status: DC | PRN
  Administered 2017-05-24: 0.5 mg via INTRAVENOUS

## 2017-05-24 MED ORDER — ASPIRIN EC 325 MG PO TBEC: 325.0000 mg | DELAYED_RELEASE_TABLET | Freq: Every day | ORAL | 0 refills | Status: DC

## 2017-05-24 MED ORDER — ONDANSETRON HCL 4 MG/2ML IJ SOLN: 4.0000 mg | Freq: Four times a day (QID) | INTRAMUSCULAR | Status: DC | PRN

## 2017-05-24 MED ORDER — CLINDAMYCIN PHOSPHATE 900 MG/50ML IV SOLN
900.0000 mg | INTRAVENOUS | Status: AC
  Administered 2017-05-24: 900 mg via INTRAVENOUS

## 2017-05-24 MED ORDER — SODIUM CHLORIDE 0.9 % IR SOLN
Status: DC | PRN
  Administered 2017-05-24: 6000 mL

## 2017-05-24 MED ORDER — FENTANYL CITRATE (PF) 100 MCG/2ML IJ SOLN
INTRAMUSCULAR | Status: AC
  Filled 2017-05-24: qty 2

## 2017-05-24 MED ORDER — SODIUM CHLORIDE 0.9 % IV SOLN: INTRAVENOUS | Status: DC

## 2017-05-24 MED ORDER — ONDANSETRON HCL 4 MG/2ML IJ SOLN
INTRAMUSCULAR | Status: AC
  Filled 2017-05-24: qty 2

## 2017-05-24 MED ORDER — PROPOFOL 10 MG/ML IV BOLUS
INTRAVENOUS | Status: AC
  Filled 2017-05-24: qty 20

## 2017-05-24 MED ORDER — HYDROMORPHONE HCL 1 MG/ML IJ SOLN: 0.5000 mg | INTRAMUSCULAR | Status: DC | PRN

## 2017-05-24 MED ORDER — LIDOCAINE 2% (20 MG/ML) 5 ML SYRINGE
INTRAMUSCULAR | Status: DC | PRN
  Administered 2017-05-24: 80 mg via INTRAVENOUS

## 2017-05-24 MED ORDER — MEPERIDINE HCL 25 MG/ML IJ SOLN: 6.2500 mg | INTRAMUSCULAR | Status: DC | PRN

## 2017-05-24 MED ORDER — MIDAZOLAM HCL 2 MG/2ML IJ SOLN
INTRAMUSCULAR | Status: AC
  Filled 2017-05-24: qty 2

## 2017-05-24 MED ORDER — METOCLOPRAMIDE HCL 5 MG/ML IJ SOLN: 5.0000 mg | Freq: Three times a day (TID) | INTRAMUSCULAR | Status: DC | PRN

## 2017-05-24 MED ORDER — EPINEPHRINE PF 1 MG/ML IJ SOLN
INTRAMUSCULAR | Status: DC | PRN
  Administered 2017-05-24: 2 mg

## 2017-05-24 MED ORDER — ONDANSETRON HCL 4 MG PO TABS: 4.0000 mg | ORAL_TABLET | Freq: Four times a day (QID) | ORAL | Status: DC | PRN

## 2017-05-24 SURGICAL SUPPLY — 38 items
BANDAGE ACE 6X5 VEL STRL LF (GAUZE/BANDAGES/DRESSINGS) ×2 IMPLANT
BANDAGE ESMARK 6X9 LF (GAUZE/BANDAGES/DRESSINGS) IMPLANT
BLADE 4.2CUDA (BLADE) ×3 IMPLANT
BLADE CUDA GRT WHITE 3.5 (BLADE) ×3 IMPLANT
BNDG CMPR 9X6 STRL LF SNTH (GAUZE/BANDAGES/DRESSINGS)
BNDG ESMARK 6X9 LF (GAUZE/BANDAGES/DRESSINGS)
BNDG GAUZE ELAST 4 BULKY (GAUZE/BANDAGES/DRESSINGS) ×3 IMPLANT
DRAPE ARTHROSCOPY W/POUCH 90 (DRAPES) ×3 IMPLANT
DRSG EMULSION OIL 3X3 NADH (GAUZE/BANDAGES/DRESSINGS) ×3 IMPLANT
DURAPREP 26ML APPLICATOR (WOUND CARE) ×3 IMPLANT
GAUZE SPONGE 4X4 12PLY STRL (GAUZE/BANDAGES/DRESSINGS) ×3 IMPLANT
GLOVE BIO SURGEON STRL SZ 6.5 (GLOVE) ×1 IMPLANT
GLOVE BIO SURGEON STRL SZ7.5 (GLOVE) ×3 IMPLANT
GLOVE BIO SURGEONS STRL SZ 6.5 (GLOVE) ×1
GLOVE BIOGEL PI IND STRL 7.0 (GLOVE) IMPLANT
GLOVE BIOGEL PI IND STRL 8 (GLOVE) ×2 IMPLANT
GLOVE BIOGEL PI INDICATOR 7.0 (GLOVE) ×2
GLOVE BIOGEL PI INDICATOR 8 (GLOVE) ×6
GLOVE SURG ORTHO 8.0 STRL STRW (GLOVE) ×3 IMPLANT
GOWN STRL REUS W/ TWL LRG LVL3 (GOWN DISPOSABLE) ×1 IMPLANT
GOWN STRL REUS W/ TWL XL LVL3 (GOWN DISPOSABLE) ×1 IMPLANT
GOWN STRL REUS W/TWL LRG LVL3 (GOWN DISPOSABLE) ×3
GOWN STRL REUS W/TWL XL LVL3 (GOWN DISPOSABLE) ×6 IMPLANT
HOLDER KNEE FOAM BLUE (MISCELLANEOUS) ×3 IMPLANT
KNEE WRAP E Z 3 GEL PACK (MISCELLANEOUS) ×3 IMPLANT
MANIFOLD NEPTUNE II (INSTRUMENTS) ×3 IMPLANT
NDL SAFETY ECLIPSE 18X1.5 (NEEDLE) ×1 IMPLANT
NEEDLE HYPO 18GX1.5 SHARP (NEEDLE) ×3
PACK ARTHROSCOPY DSU (CUSTOM PROCEDURE TRAY) ×3 IMPLANT
PACK BASIN DAY SURGERY FS (CUSTOM PROCEDURE TRAY) ×3 IMPLANT
PROBE BIPOLAR ARTHRO 85MM 30D (MISCELLANEOUS) IMPLANT
PROBE BIPOLAR ATHRO 135MM 90D (MISCELLANEOUS) IMPLANT
SUT ETHILON 4 0 PS 2 18 (SUTURE) ×3 IMPLANT
SYR 5ML LL (SYRINGE) ×3 IMPLANT
TOWEL OR 17X24 6PK STRL BLUE (TOWEL DISPOSABLE) ×3 IMPLANT
TOWEL OR NON WOVEN STRL DISP B (DISPOSABLE) ×3 IMPLANT
TUBING ARTHRO INFLOW-ONLY STRL (TUBING) ×3 IMPLANT
WATER STERILE IRR 1000ML POUR (IV SOLUTION) ×3 IMPLANT

## 2017-05-24 NOTE — Anesthesia Postprocedure Evaluation (Signed)
Anesthesia Post Note  Patient: [name]  Procedure(s) Performed: KNEE ARTHROSCOPY WITH MEDIAL MENISECTOMY (Left Knee) CHONDROPLASTY (Left Knee)     Patient location during evaluation: PACU Anesthesia Type: General Level of consciousness: awake and alert and oriented Pain management: pain level controlled Vital Signs Assessment: post-procedure vital signs reviewed and stable Respiratory status: spontaneous breathing, nonlabored ventilation and respiratory function stable Cardiovascular status: blood pressure returned to baseline and stable Postop Assessment: no apparent nausea or vomiting Anesthetic complications: no    Last Vitals:  Vitals:   05/24/17 1415 05/24/17 1434  BP: 121/77 (!) 119/59  Pulse: (!) 51 (!) 59  Resp: 16 16  Temp:  36.6 C  SpO2: 98% 99%    Last Pain:  Vitals:   05/24/17 1434  TempSrc: Oral  PainSc: 2         RLE Motor Response: Purposeful movement (05/24/17 1435) RLE Sensation: Numbness (05/24/17 1435)      Raeshawn Vo A.

## 2017-05-24 NOTE — Brief Op Note (Signed)
05/24/2017  1:22 PM  PATIENT:  Jason Sloan  62 y.o. male  PRE-OPERATIVE DIAGNOSIS:  LEFT KNEE MEDIAL MENISCUS TEAR  POST-OPERATIVE DIAGNOSIS:  LEFT KNEE MEDIAL MENISCUS TEAR  PROCEDURE:  Procedure(s): KNEE ARTHROSCOPY WITH MEDIAL MENISECTOMY (Left) CHONDROPLASTY (Left)  SURGEON:  Surgeon(s) and Role:    Earlie Server, MD - Primary  PHYSICIAN ASSISTANT:   ASSISTANTS:    ANESTHESIA:   general  EBL:  5 mL   BLOOD ADMINISTERED:none  DRAINS: none   LOCAL MEDICATIONS USED:   SPECIMEN:  No Specimen  DISPOSITION OF SPECIMEN:  N/A  COUNTS:  YES  TOURNIQUET:  * No tourniquets in log *  DICTATION: .Other Dictation: Dictation Number unknown  PLAN OF CARE: Discharge to home after PACU  PATIENT DISPOSITION:  PACU - hemodynamically stable.   Delay start of Pharmacological VTE agent (>24hrs) due to surgical blood loss or risk of bleeding: yes

## 2017-05-24 NOTE — Transfer of Care (Signed)
Immediate Anesthesia Transfer of Care Note  Patient: Jason Sloan  Procedure(s) Performed: KNEE ARTHROSCOPY WITH MEDIAL MENISECTOMY (Left Knee) CHONDROPLASTY (Left Knee)  Patient Location: PACU  Anesthesia Type:General  Level of Consciousness: awake and alert   Airway & Oxygen Therapy: Patient Spontanous Breathing and Patient connected to face mask oxygen  Post-op Assessment: Report given to RN and Post -op Vital signs reviewed and stable  Post vital signs: Reviewed and stable  Last Vitals:  Vitals Value Taken Time  BP 122/101 05/24/2017  1:30 PM  Temp    Pulse 78 05/24/2017  1:32 PM  Resp 18 05/24/2017  1:32 PM  SpO2 100 % 05/24/2017  1:32 PM  Vitals shown include unvalidated device data.  Last Pain:  Vitals:   05/24/17 1026  TempSrc: Oral  PainSc: 0-No pain         Complications: No apparent anesthesia complications

## 2017-05-24 NOTE — Anesthesia Procedure Notes (Signed)
Procedure Name: LMA Insertion Date/Time: 05/24/2017 12:37 PM Performed by: Gwyndolyn Saxon, CRNA Pre-anesthesia Checklist: Patient identified, Emergency Drugs available, Suction available, Patient being monitored and Timeout performed Patient Re-evaluated:Patient Re-evaluated prior to induction Oxygen Delivery Method: Circle system utilized Preoxygenation: Pre-oxygenation with 100% oxygen Induction Type: IV induction Ventilation: Mask ventilation without difficulty LMA: LMA inserted LMA Size: 5.0 Number of attempts: 1 Placement Confirmation: positive ETCO2,  CO2 detector and breath sounds checked- equal and bilateral Tube secured with: Tape Dental Injury: Teeth and Oropharynx as per pre-operative assessment

## 2017-05-24 NOTE — Interval H&P Note (Signed)
History and Physical Interval Note:  05/24/2017 12:05 PM  Jason Sloan  has presented today for surgery, with the diagnosis of LEFT KNEE MEDIAL MENISCUS TEAR  The various methods of treatment have been discussed with the patient and family. After consideration of risks, benefits and other options for treatment, the patient has consented to  Procedure(s): KNEE ARTHROSCOPY WITH MEDIAL MENISECTOMY (Left) as a surgical intervention .  The patient's history has been reviewed, patient examined, no change in status, stable for surgery.  I have reviewed the patient's chart and labs.  Questions were answered to the patient's satisfaction.     Yvette Rack

## 2017-05-24 NOTE — Discharge Instructions (Signed)
Diet: As you were doing prior to hospitalization  ° °Activity: Increase activity slowly as tolerated  °No lifting or driving for 48 hours. ° °Shower: May shower without a dressing on post op day #2, NO SOAKING in tub  ° °Dressing: You may change your dressing on post op day #2.  °Then change the dressing daily with sterile 4"x4"s gauze dressing  °Or band aids. ° °Weight Bearing: weight bearing as tolerated. ° °To prevent constipation: you may use a stool softener such as -  °Colace ( over the counter) 100 mg by mouth twice a day  °Drink plenty of fluids ( prune juice may be helpful) and high fiber foods  °Miralax ( over the counter) for constipation as needed.  ° °Precautions: If you experience chest pain or shortness of breath - call 911 immediately For transfer to the hospital emergency department!!  °If you develop a fever greater that 101 F, purulent drainage from wound, increased redness or drainage from wound, or calf pain -- Call the office  ° °Follow- Up Appointment: Please call for an appointment to be seen in 1 week or as previously scheduled °[place] - [phone] ° ° ° °Post Anesthesia Home Care Instructions ° °Activity: °Get plenty of rest for the remainder of the day. A responsible individual must stay with you for 24 hours following the procedure.  °For the next 24 hours, DO NOT: °-Drive a car °-Operate machinery °-Drink alcoholic beverages °-Take any medication unless instructed by your physician °-Make any legal decisions or sign important papers. ° °Meals: °Start with liquid foods such as gelatin or soup. Progress to regular foods as tolerated. Avoid greasy, spicy, heavy foods. If nausea and/or vomiting occur, drink only clear liquids until the nausea and/or vomiting subsides. Call your physician if vomiting continues. ° °Special Instructions/Symptoms: °Your throat may feel dry or sore from the anesthesia or the breathing tube placed in your throat during surgery. If this causes discomfort,  gargle with warm salt water. The discomfort should disappear within 24 hours. ° °If you had a scopolamine patch placed behind your ear for the management of post- operative nausea and/or vomiting: ° °1. The medication in the patch is effective for 72 hours, after which it should be removed.  Wrap patch in a tissue and discard in the trash. Wash hands thoroughly with soap and water. °2. You may remove the patch earlier than 72 hours if you experience unpleasant side effects which may include dry mouth, dizziness or visual disturbances. °3. Avoid touching the patch. Wash your hands with soap and water after contact with the patch. °  ° °

## 2017-05-24 NOTE — Anesthesia Preprocedure Evaluation (Addendum)
Anesthesia Evaluation  Patient identified by MRN, date of birth, ID band Patient awake    Reviewed: Allergy & Precautions, NPO status , Patient's Chart, lab work & pertinent test results  Airway Mallampati: II  TM Distance: >3 FB Neck ROM: Full    Dental no notable dental hx. (+) Teeth Intact, Caps   Pulmonary neg pulmonary ROS,    Pulmonary exam normal breath sounds clear to auscultation       Cardiovascular + Peripheral Vascular Disease  Normal cardiovascular exam Rhythm:Regular Rate:Normal  Hx/o DVT   Neuro/Psych negative neurological ROS  negative psych ROS   GI/Hepatic negative GI ROS, Neg liver ROS,   Endo/Other  negative endocrine ROS  Renal/GU negative Renal ROS  negative genitourinary   Musculoskeletal Torn medial meniscus right knee DDD cervical spine   Abdominal   Peds  Hematology negative hematology ROS (+)   Anesthesia Other Findings   Reproductive/Obstetrics                            Anesthesia Physical Anesthesia Plan  ASA: II  Anesthesia Plan: General   Post-op Pain Management:    Induction: Intravenous  PONV Risk Score and Plan:   Airway Management Planned: LMA  Additional Equipment:   Intra-op Plan:   Post-operative Plan: Extubation in OR  Informed Consent: I have reviewed the patients History and Physical, chart, labs and discussed the procedure including the risks, benefits and alternatives for the proposed anesthesia with the patient or authorized representative who has indicated his/her understanding and acceptance.   Dental advisory given  Plan Discussed with: CRNA, Anesthesiologist and Surgeon  Anesthesia Plan Comments:         Anesthesia Quick Evaluation

## 2017-05-25 ENCOUNTER — Encounter (HOSPITAL_BASED_OUTPATIENT_CLINIC_OR_DEPARTMENT_OTHER): Payer: Self-pay | Admitting: Orthopedic Surgery

## 2017-05-25 NOTE — Op Note (Signed)
NAME:  RAWLEIGH, RODE NO.:  [id number]  MEDICAL RECORD NO.:  [id number]  LOCATION:                                 FACILITY:  PHYSICIAN:  Lockie Pares, M.D.         DATE OF BIRTH:  DATE OF PROCEDURE:  05/24/2017 DATE OF DISCHARGE:                              OPERATIVE REPORT   PREOPERATIVE DIAGNOSIS:  Posterior horn medial meniscus tear, left knee.  POSTOPERATIVE DIAGNOSES: 1. Posterior horn medial meniscus tear, left knee. 2. Chondromalacia patella.  OPERATIONS: 1. Arthroscopic partial medial meniscectomy (40%). 2. Arthroscopic chondroplasty patella.  SURGEON:  Lockie Pares, M.D.  ANESTHESIA:  MAC anesthesia.  DESCRIPTION OF PROCEDURE:  Leg holder.  Anesthesia.  Supine positioning. No tourniquet.  Inferomedial and inferolateral portals created.  Lateral compartment and lateral meniscus normal.  Mild-to-moderate chondromalacia of patella identified grade 3 generalized debrided. Trochlear groove was relatively spared.  Complex tear with a horizontal cleavage component with a radial based tear of the posterior horn meniscus.  We probably resected about 30%, maximum 40% of the meniscus substance.  Elective stable rim posteriorly.  Very mild chondral irregularity around this was debrided and found there was no significant OA certainly in any area, particularly the medial side of the knee or lateral, though there was beginnings of OA on the medial side and mild- to-moderate chondromalacia under the patella.  Knee drained free of fluid.  Portals closed with nylon.  Infiltrated the joint with Marcaine 10 mL with 80 mg of Depo-Medrol with additional 20 mL into the subcutaneous tissues and intra-articularly.  Lightly compressive sterile dressing was applied.  Taken to recovery room in stable condition.     Lockie Pares, M.D.   ______________________________ Lockie Pares, M.D.    WDC/MEDQ  D:  05/24/2017  T:  05/24/2017  Job:  [id number]

## 2018-07-26 IMAGING — DX DG THORACIC SPINE 2V
3 series · 4 of 4 positions shown · non-contrast
Comparison: None.

CLINICAL DATA: Acute thoracic spine pain from injury 9 days ago at

EXAM:
THORACIC SPINE 2 VIEWS

[t-spine ap]
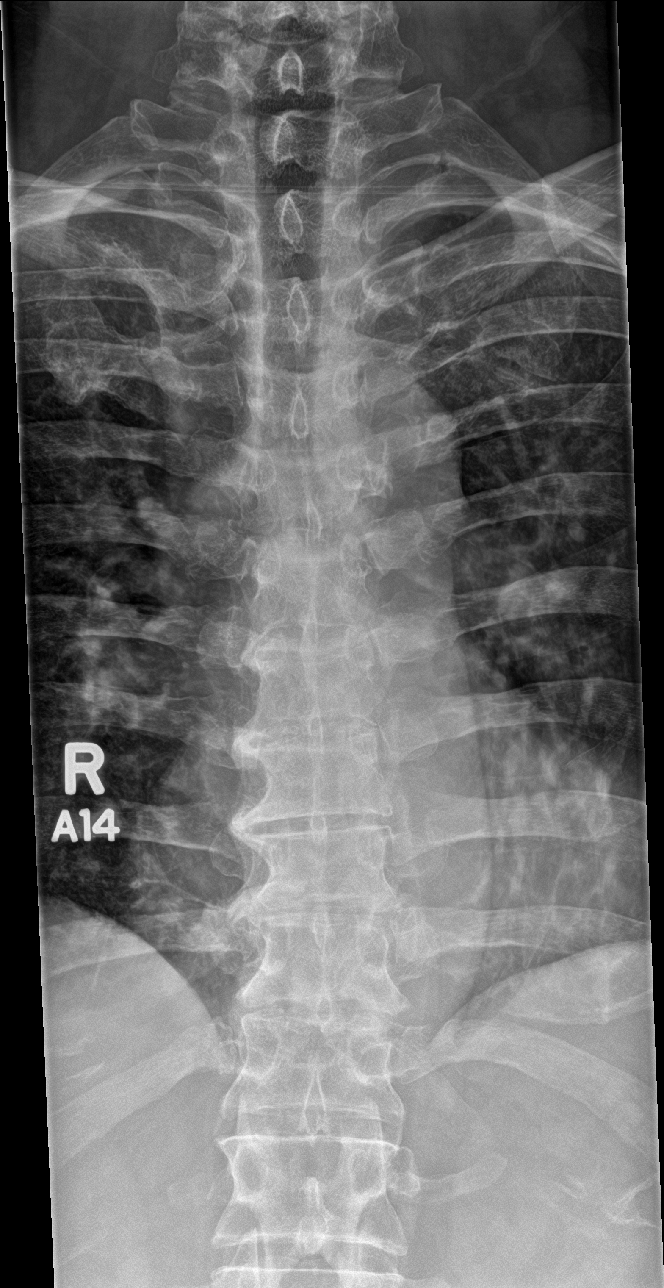

[Series 2: t-spine lat · 0.14mm/px · 2 of 2 slices shown]
[im 1/2]
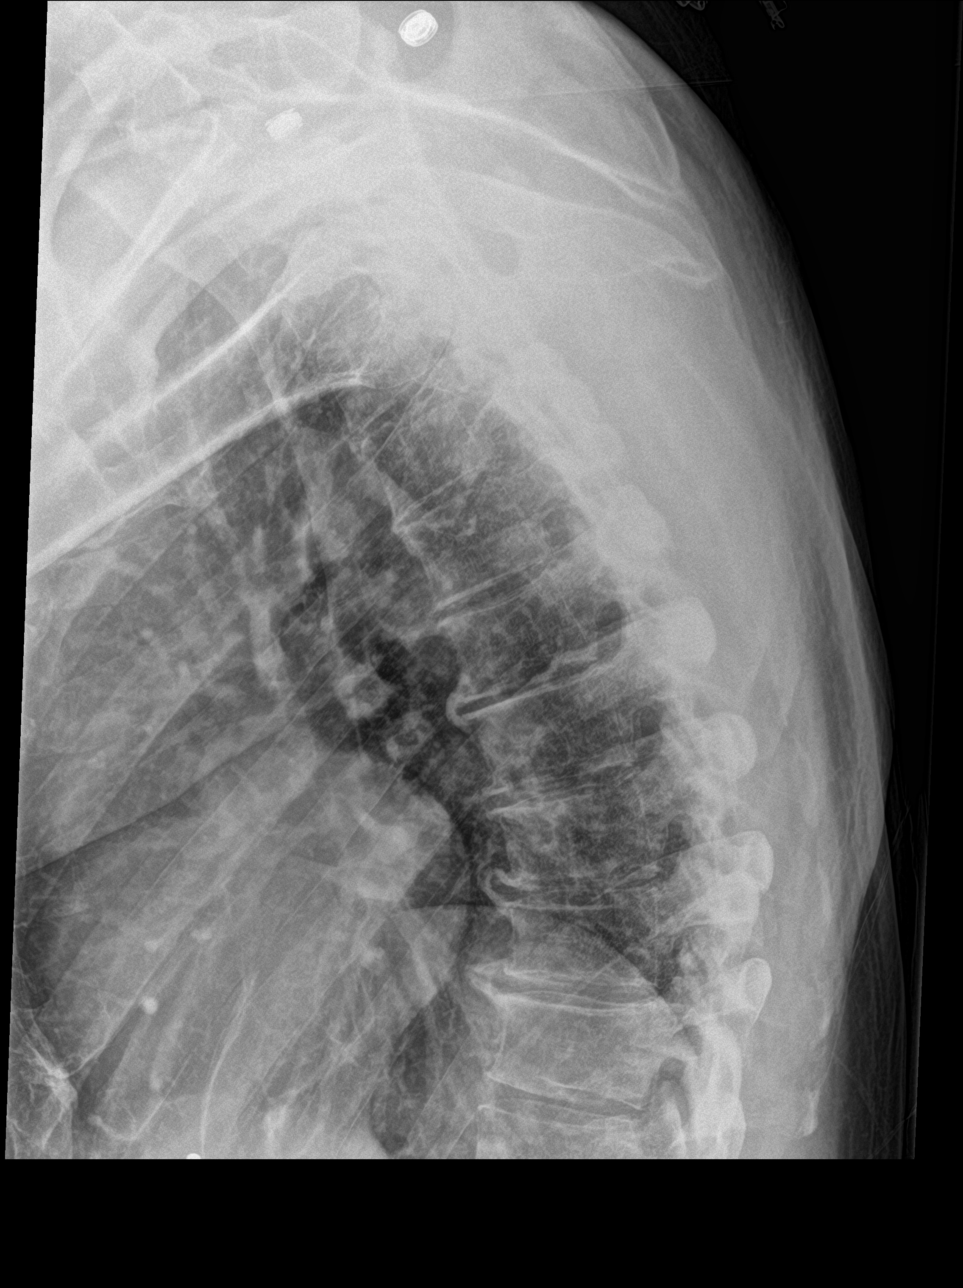
[im 2/2]
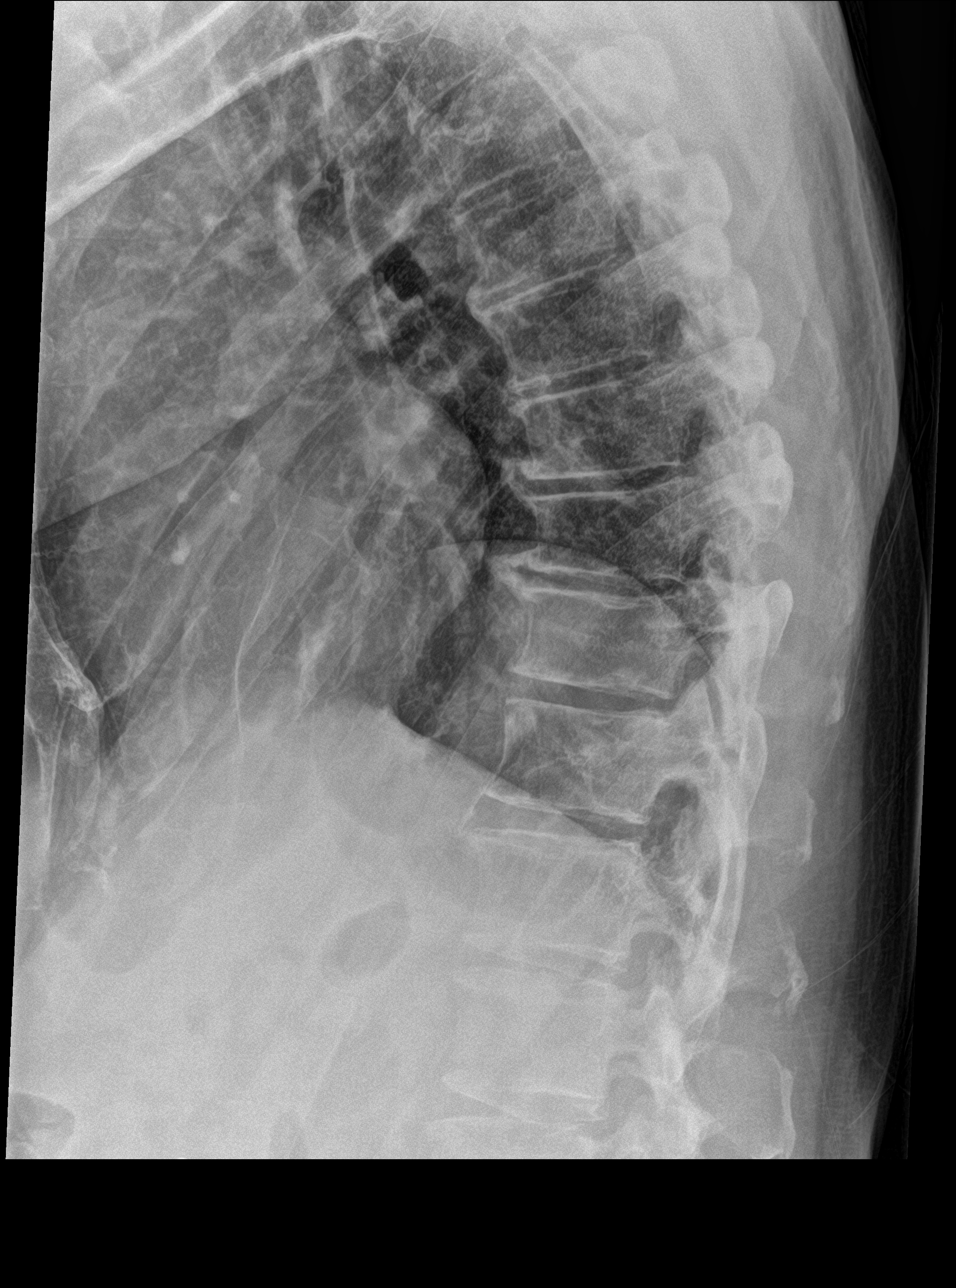

[t-spine swimmers]
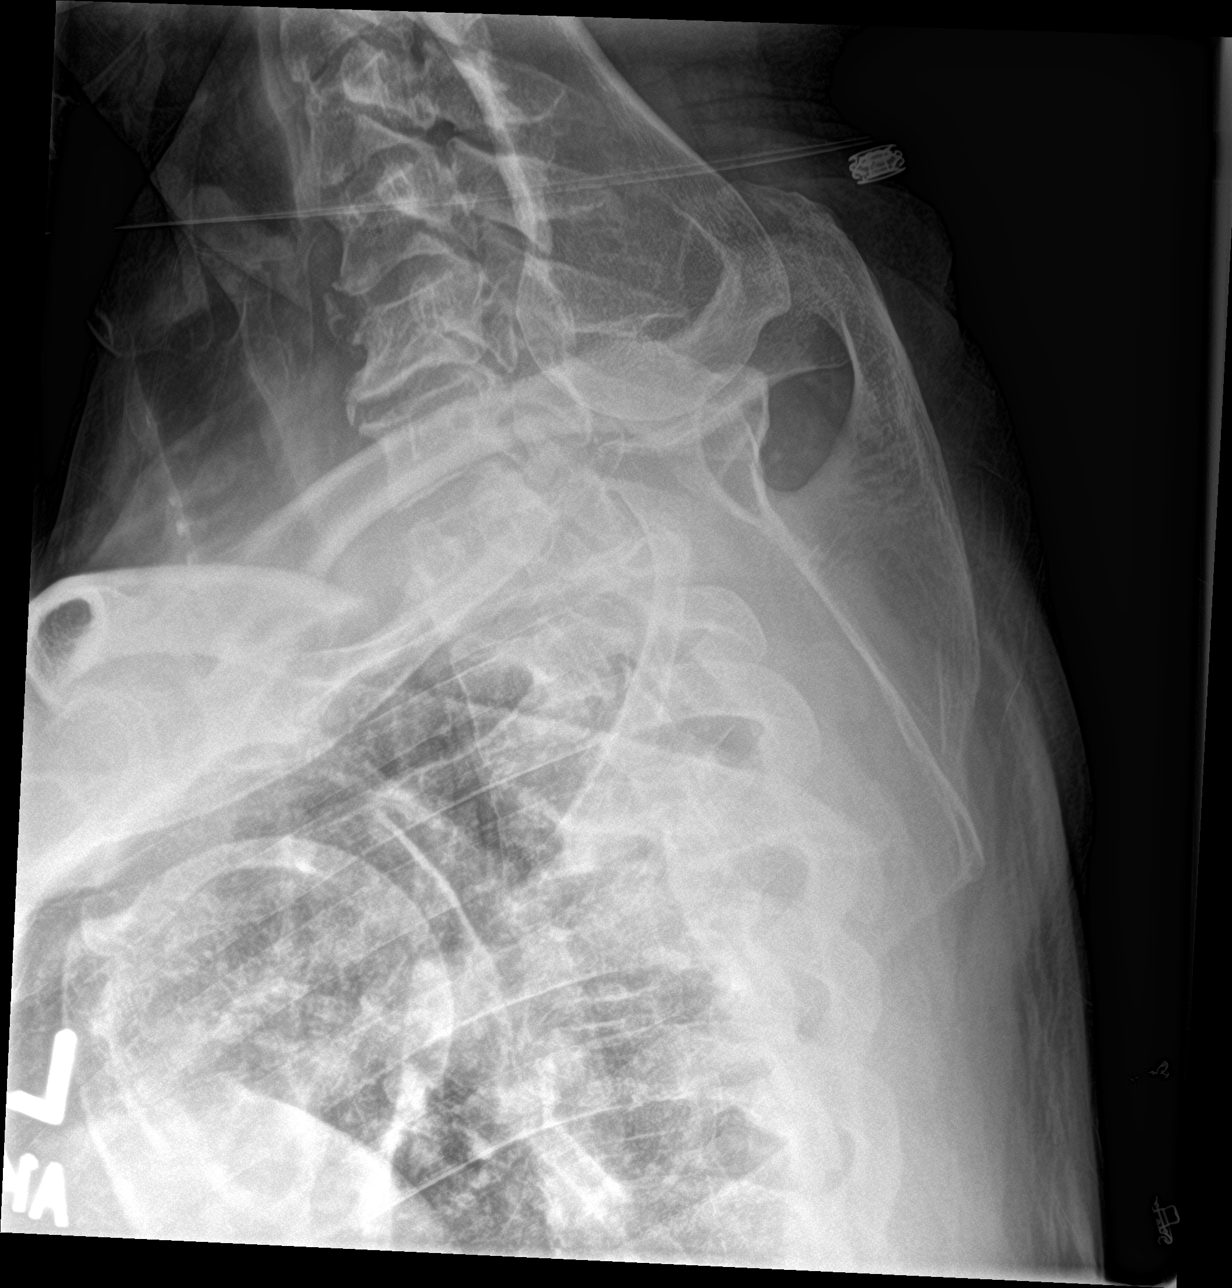

[4 of 4 positions shown; findings below may reference images not displayed]

FINDINGS: No fracture or spondylolisthesis is noted. Mild anterior osteophyte
formation is noted in the midthoracic spine. Disc spaces are
well-maintained.
IMPRESSION: Mild degenerative changes as described above. No acute abnormality
seen in the thoracic spine.

## 2020-10-21 ENCOUNTER — Other Ambulatory Visit: Payer: Self-pay | Admitting: Urology

## 2020-10-21 DIAGNOSIS — R972 Elevated prostate specific antigen [PSA]: Secondary | ICD-10-CM

## 2020-10-29 ENCOUNTER — Ambulatory Visit
Admission: RE | Admit: 2020-10-29 | Discharge: 2020-10-29 | Disposition: A | Discharge status: Home / Self Care | Payer: Self-pay | Source: Ambulatory Visit | Attending: Urology | Admitting: Urology

## 2020-10-29 ENCOUNTER — Other Ambulatory Visit: Payer: Self-pay

## 2020-10-29 DIAGNOSIS — R972 Elevated prostate specific antigen [PSA]: Secondary | ICD-10-CM | POA: Insufficient documentation

## 2020-10-29 MED ORDER — GADOBUTROL 1 MMOL/ML IV SOLN
10.0000 mL | Freq: Once | INTRAVENOUS | Status: AC | PRN
  Administered 2020-10-29: 10 mL via INTRAVENOUS

## 2020-11-12 NOTE — H&P (Signed)
Jason Sloan, MICHAUX MEDICAL RECORD NO: [id number] ACCOUNT NO: [id number] DATE OF BIRTH: [birth year] FACILITY: [facility] LOCATION: [facility] PHYSICIAN: Otelia Limes. Yves Dill, MD  History and Physical   DATE OF ADMISSION: 11/19/2020  Same day surgery 11/19/2020.  CHIEF COMPLAINT:  Elevated PSA and abnormal prostate gland MRI scan.  HISTORY OF PRESENT ILLNESS:  The patient is a 65 year old white male who had a routine series of laboratory studies done for life insurance approval and had an elevated PSA of 8.3 ng/mL.  He was evaluated with Exosome IntelliScore that was 21.72, which  was above the cut off for higher risk of high-grade prostate cancer.  He subsequently underwent prostate gland MRI scan indicating a 58.6 mL prostate.  He had a 1.7 cm PI-RADS category 5 lesion of the anterior apical transition zone.  He comes in now for  UroNav fusion biopsy of the prostate gland.  PAST MEDICAL HISTORY:  ALLERGIES:  THE PATIENT WAS ALLERGIC TO PENICILLIN.  MEDICATIONS:  He currently is not taking any medications.  PAST SURGICAL HISTORY:    1.  Lumbar laminectomy in 1990. 2.  Right knee surgery in 2014. 3.  Left knee surgery in 2018.  PAST AND CURRENT MEDICAL CONDITIONS:  The patient denies chest pain, shortness of breath, diabetes, stroke, or heart disease.  SOCIAL HISTORY:  The patient denies tobacco or alcohol use.  FAMILY HISTORY:  Father died at age 15 of stomach cancer.  Mother is living, age 58, with heart disease.  There is no family history of urologic disease or urological malignancy.  PHYSICAL EXAMINATION: VITAL SIGNS:  Weight 239 pounds, height 6 feet 4 inches, BMI 29. GENERAL:  Well-developed, well-nourished white male in no acute distress. HEENT:  Sclerae were clear.  Pupils were equally round and reactive to light and accommodation.  Extraocular movements were intact. NECK:  No palpable masses or tenderness.  No audible carotid bruits. LYMPHATIC:  No palpable cervical or  inguinal adenopathy. PULMONARY:  Lungs clear to auscultation. CARDIOVASCULAR:  Regular rhythm and rate without audible murmurs. ABDOMEN:  Soft, nontender.  No CVA tenderness. GENITOURINARY: Circumcised.  Testes were smooth and nontender, approximately 20 mL in size each. RECTAL: 30 gram, smooth, nontender prostate. NEUROMUSCULAR:  Alert and oriented x3.  IMPRESSION:    1.  Elevated PSA. 2.  Abnormal prostate gland MRI scan.  PLAN:  UroNav fusion biopsy of prostate gland.   ROH D: 11/10/2020 2:54:45 pm T: 11/10/2020 3:52:00 pm  JOB: [id number]/ [id number]

## 2020-11-13 ENCOUNTER — Other Ambulatory Visit
Admission: RE | Admit: 2020-11-13 | Discharge: 2020-11-13 | Disposition: A | Discharge status: Home / Self Care | Payer: Self-pay | Source: Ambulatory Visit | Attending: Urology | Admitting: Urology

## 2020-11-13 ENCOUNTER — Other Ambulatory Visit: Payer: Self-pay

## 2020-11-13 NOTE — Patient Instructions (Addendum)
Your procedure is scheduled on: Thursday November 19, 2020. Report to Day Surgery inside [facility] 2nd floor stop by admissions desk first before getting on elevator.  To find out your arrival time please call [phone] between 1PM - 3PM on Wednesday November 18, 2020.  Remember: Instructions that are not followed completely may result in serious medical risk,  up to and including death, or upon the discretion of your surgeon and anesthesiologist your  surgery may need to be rescheduled.     _X__ 1. Do not eat food after midnight the night before your procedure.                 No chewing gum or hard candies. You may drink clear liquids up to 2 hours                 before you are scheduled to arrive for your surgery- DO not drink clear                 liquids within 2 hours of the start of your surgery.                 Clear Liquids include:  water, apple juice without pulp, clear Gatorade, G2 or                  Gatorade Zero (avoid Red/Purple/Blue), Black Coffee or Tea (Do not add                 anything to coffee or tea).  __X__2.  On the morning of surgery brush your teeth with toothpaste and water, you                may rinse your mouth with mouthwash if you wish.  Do not swallow any toothpaste of mouthwash.     _X__ 3.  No Alcohol for 24 hours before or after surgery.   _X__ 4.  Do Not Smoke or use e-cigarettes For 24 Hours Prior to Your Surgery.                 Do not use any chewable tobacco products for at least 6 hours prior to                 Surgery.  _X__  5.  Do not use any recreational drugs (marijuana, cocaine, heroin, ecstasy, MDMA or other)                For at least one week prior to your surgery.  Combination of these drugs with anesthesia                May have life threatening results.  __X__ 6.  Notify your doctor if there is any change in your medical condition      (cold, fever, infections).     Do not wear jewelry, make-up,  hairpins, clips or nail polish. Do not wear lotions, powders, or perfumes. You may wear deodorant. Do not shave 48 hours prior to surgery. Men may shave face and neck. Do not bring valuables to the hospital.    [facility] is not responsible for any belongings or valuables.  Contacts, dentures or bridgework may not be worn into surgery. Leave your suitcase in the car. After surgery it may be brought to your room. For patients admitted to the hospital, discharge time is determined by your treatment team.   Patients discharged the day of surgery will not be allowed to  drive home.   Make arrangements for someone to be with you for the first 24 hours of your Same Day Discharge.   __X__ Take these medicines the morning of surgery with A SIP OF WATER:    1. None   2.   3.   4.  5.  6.  __X __ Fleet Enema (as directed) (patient stated has two boxes at home and is clear on using them until clear)  ____ Use CHG Soap (or wipes) as directed  ____ Use Benzoyl Peroxide Gel as instructed  ____ Use inhalers on the day of surgery  ____ Stop metformin 2 days prior to surgery    ____ Take 1/2 of usual insulin dose the night before surgery. No insulin the morning          of surgery.   ____ Call your PCP, cardiologist, or Pulmonologist if taking Coumadin/Plavix/aspirin and ask when to stop before your surgery.   __X__ One Week prior to surgery- Stop Anti-inflammatories such as Ibuprofen, Aleve, Advil, Motrin, meloxicam (MOBIC), diclofenac, etodolac, ketorolac, Toradol, Daypro, piroxicam, Goody's or BC powders. OK TO USE TYLENOL IF NEEDED   ____ Stop supplements until after surgery.    ____ Bring C-Pap to the hospital.    If you have any questions regarding your pre-procedure instructions,  Please call Pre-admit Testing at [phone].

## 2020-11-19 ENCOUNTER — Encounter: Payer: Self-pay | Admitting: Urology

## 2020-11-19 ENCOUNTER — Ambulatory Visit: Payer: Self-pay | Admitting: Registered Nurse

## 2020-11-19 ENCOUNTER — Ambulatory Visit
Admission: RE | Admit: 2020-11-19 | Discharge: 2020-11-19 | Disposition: A | Discharge status: Home / Self Care | Payer: Self-pay | Attending: Urology | Admitting: Urology

## 2020-11-19 ENCOUNTER — Encounter
Admission: RE | Disposition: A | Discharge status: Home / Self Care | Payer: Self-pay | Source: Home / Self Care | Attending: Urology

## 2020-11-19 ENCOUNTER — Other Ambulatory Visit: Payer: Self-pay

## 2020-11-19 DIAGNOSIS — Z880 Allergy status to penicillin: Secondary | ICD-10-CM | POA: Insufficient documentation

## 2020-11-19 DIAGNOSIS — F17200 Nicotine dependence, unspecified, uncomplicated: Secondary | ICD-10-CM | POA: Insufficient documentation

## 2020-11-19 DIAGNOSIS — Z86718 Personal history of other venous thrombosis and embolism: Secondary | ICD-10-CM | POA: Insufficient documentation

## 2020-11-19 DIAGNOSIS — C61 Malignant neoplasm of prostate: Secondary | ICD-10-CM | POA: Insufficient documentation

## 2020-11-19 HISTORY — PX: PROSTATE BIOPSY: SHX241

## 2020-11-19 SURGERY — BIOPSY, PROSTATE
Anesthesia: General

## 2020-11-19 MED ORDER — MIDAZOLAM HCL 2 MG/2ML IJ SOLN
INTRAMUSCULAR | Status: DC | PRN
  Administered 2020-11-19: 2 mg via INTRAVENOUS

## 2020-11-19 MED ORDER — FENTANYL CITRATE (PF) 100 MCG/2ML IJ SOLN: 25.0000 ug | INTRAMUSCULAR | Status: DC | PRN

## 2020-11-19 MED ORDER — OXYCODONE HCL 5 MG PO TABS: 5.0000 mg | ORAL_TABLET | Freq: Once | ORAL | Status: DC | PRN

## 2020-11-19 MED ORDER — PROPOFOL 500 MG/50ML IV EMUL
INTRAVENOUS | Status: DC | PRN
  Administered 2020-11-19: 150 ug/kg/min via INTRAVENOUS

## 2020-11-19 MED ORDER — LIDOCAINE HCL (CARDIAC) PF 100 MG/5ML IV SOSY
PREFILLED_SYRINGE | INTRAVENOUS | Status: DC | PRN
  Administered 2020-11-19: 50 mg via INTRAVENOUS

## 2020-11-19 MED ORDER — KETAMINE HCL 50 MG/5ML IJ SOSY
PREFILLED_SYRINGE | INTRAMUSCULAR | Status: AC
  Filled 2020-11-19: qty 5

## 2020-11-19 MED ORDER — MIDAZOLAM HCL 2 MG/2ML IJ SOLN
INTRAMUSCULAR | Status: AC
  Filled 2020-11-19: qty 2

## 2020-11-19 MED ORDER — PROPOFOL 10 MG/ML IV BOLUS
INTRAVENOUS | Status: DC | PRN
  Administered 2020-11-19: 40 mg via INTRAVENOUS

## 2020-11-19 MED ORDER — OXYCODONE HCL 5 MG/5ML PO SOLN: 5.0000 mg | Freq: Once | ORAL | Status: DC | PRN

## 2020-11-19 MED ORDER — FAMOTIDINE 20 MG PO TABS
ORAL_TABLET | ORAL | Status: AC
  Administered 2020-11-19: 20 mg via ORAL
  Filled 2020-11-19: qty 1

## 2020-11-19 MED ORDER — CHLORHEXIDINE GLUCONATE 0.12 % MT SOLN: 15.0000 mL | Freq: Once | OROMUCOSAL | Status: AC

## 2020-11-19 MED ORDER — LEVOFLOXACIN IN D5W 500 MG/100ML IV SOLN
INTRAVENOUS | Status: AC
  Filled 2020-11-19: qty 100

## 2020-11-19 MED ORDER — ONDANSETRON HCL 4 MG/2ML IJ SOLN: 4.0000 mg | Freq: Once | INTRAMUSCULAR | Status: DC | PRN

## 2020-11-19 MED ORDER — FENTANYL CITRATE (PF) 100 MCG/2ML IJ SOLN
INTRAMUSCULAR | Status: AC
  Filled 2020-11-19: qty 2

## 2020-11-19 MED ORDER — FAMOTIDINE 20 MG PO TABS: 20.0000 mg | ORAL_TABLET | Freq: Once | ORAL | Status: AC

## 2020-11-19 MED ORDER — FENTANYL CITRATE (PF) 100 MCG/2ML IJ SOLN
INTRAMUSCULAR | Status: DC | PRN
  Administered 2020-11-19 (×2): 25 ug via INTRAVENOUS

## 2020-11-19 MED ORDER — LIDOCAINE HCL (PF) 2 % IJ SOLN
INTRAMUSCULAR | Status: AC
  Filled 2020-11-19: qty 5

## 2020-11-19 MED ORDER — LEVOFLOXACIN 500 MG PO TABS: 500.0000 mg | ORAL_TABLET | Freq: Every day | ORAL | 1 refills | Status: DC

## 2020-11-19 MED ORDER — LEVOFLOXACIN IN D5W 500 MG/100ML IV SOLN
500.0000 mg | Freq: Once | INTRAVENOUS | Status: AC
  Administered 2020-11-19: 500 mg via INTRAVENOUS

## 2020-11-19 MED ORDER — CHLORHEXIDINE GLUCONATE 0.12 % MT SOLN
OROMUCOSAL | Status: AC
  Administered 2020-11-19: 15 mL via OROMUCOSAL
  Filled 2020-11-19: qty 15

## 2020-11-19 MED ORDER — ORAL CARE MOUTH RINSE: 15.0000 mL | Freq: Once | OROMUCOSAL | Status: AC

## 2020-11-19 MED ORDER — LACTATED RINGERS IV SOLN
INTRAVENOUS | Status: DC
Start: 2020-11-19 — End: 2020-11-19

## 2020-11-19 MED ORDER — GENTAMICIN SULFATE 40 MG/ML IJ SOLN
80.0000 mg | Freq: Once | INTRAVENOUS | Status: AC
  Administered 2020-11-19: 80 mg via INTRAVENOUS
  Filled 2020-11-19: qty 2

## 2020-11-19 MED ORDER — ACETAMINOPHEN 10 MG/ML IV SOLN: 1000.0000 mg | Freq: Once | INTRAVENOUS | Status: DC | PRN

## 2020-11-19 MED ORDER — EPHEDRINE SULFATE 50 MG/ML IJ SOLN
INTRAMUSCULAR | Status: DC | PRN
  Administered 2020-11-19 (×3): 5 mg via INTRAVENOUS

## 2020-11-19 MED ORDER — LACTATED RINGERS IV SOLN: INTRAVENOUS | Status: DC

## 2020-11-19 MED ORDER — PROPOFOL 10 MG/ML IV BOLUS
INTRAVENOUS | Status: AC
  Filled 2020-11-19: qty 20

## 2020-11-19 SURGICAL SUPPLY — 23 items
COVER MAYO STAND REUSABLE (DRAPES) ×2 IMPLANT
COVER TRANSDUCER ULTRASOUND (MISCELLANEOUS) ×1 IMPLANT
FEE DELIVERY LASER CO2 FORTEC (MISCELLANEOUS) IMPLANT
GLOVE SURG ENC MOIS LTX SZ7 (GLOVE) ×4 IMPLANT
GUIDE NDL ENDOCAV 16-18 CVR (NEEDLE) IMPLANT
GUIDE NEEDLE ENDOCAV 16-18 CVR (NEEDLE) IMPLANT
INST BIOPSY MAXCORE 18GX25 (NEEDLE) ×2 IMPLANT
LASER CO2 FORTEC DELIVERY FEE (MISCELLANEOUS) IMPLANT
LASER XPS ACCESS DROP OFF FEE (MISCELLANEOUS) IMPLANT
MANIFOLD NEPTUNE II (INSTRUMENTS) ×1 IMPLANT
NDL BIO TRUPATH DISP 18GX25 (MISCELLANEOUS) IMPLANT
NDL GUIDE BIOPSY 644068 (NEEDLE) IMPLANT
NEEDLE BIO TRUPATH DISP 18GX25 (MISCELLANEOUS) ×2 IMPLANT
NEEDLE GUIDE BIOPSY 644068 (NEEDLE) IMPLANT
PROBE BIOSP ALOKA ALPHA6 PROST (MISCELLANEOUS) ×1 IMPLANT
PROBE URONAV BK 8808E 8818 HLD (MISCELLANEOUS) IMPLANT
STRAP SAFETY 5IN WIDE (MISCELLANEOUS) ×2 IMPLANT
SURGILUBE 2OZ TUBE FLIPTOP (MISCELLANEOUS) ×2 IMPLANT
TOWEL OR 17X26 4PK STRL BLUE (TOWEL DISPOSABLE) ×2 IMPLANT
URONAV BK 8808E 8818 PROBE HLD (MISCELLANEOUS) ×2
URONAV MRI FUSION TWO PATIENTS (MISCELLANEOUS) ×1 IMPLANT
URONAV ULTRASOUND (MISCELLANEOUS) ×1 IMPLANT
WATER STERILE IRR 500ML POUR (IV SOLUTION) ×2 IMPLANT

## 2020-11-19 NOTE — H&P (Signed)
Date of Initial H&P: 11/10/20  History reviewed, patient examined, no change in status, stable for surgery. 

## 2020-11-19 NOTE — Transfer of Care (Signed)
Immediate Anesthesia Transfer of Care Note  Patient: Jason Sloan  Procedure(s) Performed: PROSTATE BIOPSY URONAV  Patient Location: PACU  Anesthesia Type:General  Level of Consciousness: drowsy  Airway & Oxygen Therapy: Patient Spontanous Breathing and Patient connected to face mask oxygen  Post-op Assessment: Report given to RN and Post -op Vital signs reviewed and stable  Post vital signs: Reviewed and stable  Last Vitals:  Vitals Value Taken Time  BP 97/57 11/19/20 1041  Temp    Pulse 58 11/19/20 1043  Resp 11 11/19/20 1043  SpO2 96 % 11/19/20 1043  Vitals shown include unvalidated device data.  Last Pain:  Vitals:   11/19/20 0926  TempSrc: Temporal  PainSc: 0-No pain         Complications: No notable events documented.

## 2020-11-19 NOTE — Op Note (Signed)
Preoperative diagnosis: 1.  Elevated PSA (R97.2)                                           2.  Abnormal prostate gland MRI scan (D40.0)   Postoperative diagnosis: Same  Procedure: Uronav fusion transrectal ultrasound biopsy of the prostate (CPT [phone], [id number])  Surgeon: Otelia Limes. Yves Dill MD  Anesthesia: General  Indications:See the history and physical. After informed consent the above procedure(s) were requested     Technique and findings: After adequate general anesthesia been obtained patient was placed into left lateral decubitus position and DRE was performed.  The rectal vault was noted to be clear.  The ultrasound probe was placed and images acquired.  The ultrasound images were then fused with the MRI images and the region of interest in the anterior zone was identified.  4 core biopsies were taken here.  At this point standard 12 core systematic core biopsies were performed.  Ultrasound probe was removed.  Blood loss was minimal.  The procedure was then terminated and patient transferred to the recovery room in stable condition.

## 2020-11-19 NOTE — Discharge Instructions (Addendum)
Transrectal Ultrasound-Guided Prostate Biopsy, Care After This sheet gives you information about how to care for yourself after your procedure. Your doctor may also give you more specific instructions. If you have problems or questions, contact your doctor. What can I expect after the procedure? After the procedure, it is common to have: Pain and discomfort in your butt, especially while sitting. Pink-colored pee (urine), due to small amounts of blood in the pee. Burning while peeing (urinating). Blood in your poop (stool). Bleeding from your butt. Blood in your semen. Follow these instructions at home: Medicines Take over-the-counter and prescription medicines only as told by your doctor. If you were prescribed antibiotic medicine, take it as told by your doctor. Do not stop taking the antibiotic even if you start to feel better. Activity Do not drive for 24 hours if you were given a medicine to help you relax (sedative) during your procedure. Return to your normal activities as told by your doctor. Ask your doctor what activities are safe for you. Ask your doctor when it is okay for you to have sex. Do not lift anything that is heavier than 10 lb (4.5 kg), or the limit that you are told, until your doctor says that it is safe. General instructions Drink enough water to keep your pee pale yellow. Watch your pee, poop, and semen for new bleeding or bleeding that gets worse. Keep all follow-up visits as told by your doctor. This is important. Contact a doctor if you: Have blood clots in your pee or poop. Notice that your pee smells bad or unusual. Have very bad belly pain. Have trouble peeing. Notice that your lower belly feels firm. Have blood in your pee for more than 2 weeks after the procedure. Have blood in your semen for more than 2 months after the procedure. Have problems getting an erection. Feel sick to your stomach (nauseous) or throw up (vomit). Have new or worse bleeding  in your pee, poop, or semen. Get help right away if you: Have a fever or chills. Have bright red pee. Have very bad pain that does not get better with medicine. Cannot pee. Summary After this procedure, it is common to have pain and discomfort around your butt, especially while sitting. You may have blood in your pee and poop. It is common to have blood in your semen for 1-2 months. If you were prescribed antibiotic medicine, take it as told by your doctor. Do not stop taking the antibiotic even if you start to feel better. Get help right away if you have a fever or chills. This information is not intended to replace advice given to you by your health care provider. Make sure you discuss any questions you have with your health care provider. Document Revised: 12/16/2019 Document Reviewed: 10/17/2019 Elsevier Patient Education  2022 [place]   The drugs that you were given will stay in your system until tomorrow so for the next 24 hours you should not:  Drive an automobile Make any legal decisions Drink any alcoholic beverage   You may resume regular meals tomorrow.  Today it is better to start with liquids and gradually work up to solid foods.  You may eat anything you prefer, but it is better to start with liquids, then soup and crackers, and gradually work up to solid foods.   Please notify your doctor immediately if you have any unusual bleeding, trouble breathing, redness and pain at the surgery site, drainage,  fever, or pain not relieved by medication.    Additional Instructions:    Please contact your physician with any problems or Same Day Surgery at [phone], Monday through Friday 6 am to 4 pm, or [place] at [facility] number at [phone].    May take tylenol to prevent pain

## 2020-11-19 NOTE — Anesthesia Preprocedure Evaluation (Addendum)
Anesthesia Evaluation  Patient identified by MRN, date of birth, ID band Patient awake    Reviewed: Allergy & Precautions, NPO status , Patient's Chart, lab work & pertinent test results  History of Anesthesia Complications Negative for: history of anesthetic complications  Airway Mallampati: III   Neck ROM: Full    Dental no notable dental hx.    Pulmonary neg pulmonary ROS,    Pulmonary exam normal breath sounds clear to auscultation       Cardiovascular Exercise Tolerance: Good + DVT (after knee surgery, no longer on anticoagulation)  Normal cardiovascular exam Rhythm:Regular Rate:Normal     Neuro/Psych negative neurological ROS     GI/Hepatic negative GI ROS,   Endo/Other  negative endocrine ROS  Renal/GU negative Renal ROS     Musculoskeletal   Abdominal   Peds  Hematology negative hematology ROS (+)   Anesthesia Other Findings   Reproductive/Obstetrics                            Anesthesia Physical Anesthesia Plan  ASA: 2  Anesthesia Plan: General   Post-op Pain Management:    Induction: Intravenous  PONV Risk Score and Plan: 2 and Ondansetron, Dexamethasone, Propofol infusion, TIVA and Treatment may vary due to age or medical condition  Airway Management Planned: Natural Airway  Additional Equipment:   Intra-op Plan:   Post-operative Plan:   Informed Consent: I have reviewed the patients History and Physical, chart, labs and discussed the procedure including the risks, benefits and alternatives for the proposed anesthesia with the patient or authorized representative who has indicated his/her understanding and acceptance.       Plan Discussed with: CRNA  Anesthesia Plan Comments: (GETA backup discussed.)       Anesthesia Quick Evaluation

## 2020-11-19 NOTE — Anesthesia Postprocedure Evaluation (Signed)
Anesthesia Post Note  Patient: Jason Sloan  Procedure(s) Performed: PROSTATE BIOPSY URONAV  Patient location during evaluation: PACU Anesthesia Type: General Level of consciousness: awake and alert, oriented and patient cooperative Pain management: pain level controlled Vital Signs Assessment: post-procedure vital signs reviewed and stable Respiratory status: spontaneous breathing, nonlabored ventilation and respiratory function stable Cardiovascular status: blood pressure returned to baseline and stable Postop Assessment: adequate PO intake Anesthetic complications: no   No notable events documented.   Last Vitals:  Vitals:   11/19/20 1126 11/19/20 1136  BP: 116/68 127/74  Pulse: (!) 51 (!) 47  Resp: 18 16  Temp: 36.6 C (!) 36.1 C  SpO2: 98% 100%    Last Pain:  Vitals:   11/19/20 1136  TempSrc:   PainSc: 0-No pain                 Darrin Nipper

## 2020-11-19 NOTE — OR Nursing (Signed)
Minimal bleeding noted from Urethra at end of procedure. Dr Annie Sable notified no orders given. This Rn reported to PACU nurse during hand off report.

## 2020-11-20 LAB — SURGICAL PATHOLOGY

## 2021-02-11 ENCOUNTER — Ambulatory Visit: Payer: Self-pay | Admitting: Podiatry

## 2021-04-26 ENCOUNTER — Encounter: Payer: Self-pay | Admitting: Gastroenterology

## 2021-11-08 ENCOUNTER — Encounter: Payer: Self-pay | Admitting: Gastroenterology

## 2021-11-23 ENCOUNTER — Encounter
Admission: RE | Admit: 2021-11-23 | Discharge: 2021-11-23 | Disposition: A | Discharge status: Home / Self Care | Payer: Medicare HMO | Source: Ambulatory Visit | Attending: Urology | Admitting: Urology

## 2021-11-23 ENCOUNTER — Other Ambulatory Visit: Payer: Self-pay

## 2021-11-23 ENCOUNTER — Encounter: Payer: Self-pay | Admitting: Urgent Care

## 2021-11-23 DIAGNOSIS — K625 Hemorrhage of anus and rectum: Secondary | ICD-10-CM | POA: Insufficient documentation

## 2021-11-23 DIAGNOSIS — Z01818 Encounter for other preprocedural examination: Secondary | ICD-10-CM | POA: Insufficient documentation

## 2021-11-23 DIAGNOSIS — Z01812 Encounter for preprocedural laboratory examination: Secondary | ICD-10-CM

## 2021-11-23 HISTORY — DX: Pneumonia, unspecified organism: J18.9

## 2021-11-23 HISTORY — DX: Malignant (primary) neoplasm, unspecified: C80.1

## 2021-11-23 HISTORY — DX: Unspecified osteoarthritis, unspecified site: M19.90

## 2021-11-23 LAB — CBC
HCT: 44.8 % (ref 39.0–52.0)
Hemoglobin: 15.5 g/dL (ref 13.0–17.0)
MCH: 31.2 pg (ref 26.0–34.0)
MCHC: 34.6 g/dL (ref 30.0–36.0)
MCV: 90.1 fL (ref 80.0–100.0)
Platelets: 223 10*3/uL (ref 150–400)
RBC: 4.97 MIL/uL (ref 4.22–5.81)
RDW: 13 % (ref 11.5–15.5)
WBC: 6.9 10*3/uL (ref 4.0–10.5)
nRBC: 0 % (ref 0.0–0.2)

## 2021-11-23 LAB — BASIC METABOLIC PANEL
Anion gap: 5 (ref 5–15)
BUN: 19 mg/dL (ref 8–23)
CO2: 30 mmol/L (ref 22–32)
Calcium: 9 mg/dL (ref 8.9–10.3)
Chloride: 104 mmol/L (ref 98–111)
Creatinine, Ser: 0.94 mg/dL (ref 0.61–1.24)
GFR, Estimated: >60 mL/min (ref >60)
Glucose, Bld: 99 mg/dL (ref 70–99)
Potassium: 4.3 mmol/L (ref 3.5–5.1)
Sodium: 139 mmol/L (ref 135–145)

## 2021-11-23 NOTE — Patient Instructions (Addendum)
Your procedure is scheduled on: 12/02/21 - Thursday Report to the Registration Desk on the 1st floor of the [facility]. To find out your arrival time, please call [phone] between 1PM - 3PM on: 12/01/21 - Wednesday If your arrival time is 6:00 am, do not arrive prior to that time as the [facility] entrance doors do not open until 6:00 am.  REMEMBER: Instructions that are not followed completely may result in serious medical risk, up to and including death; or upon the discretion of your surgeon and anesthesiologist your surgery may need to be rescheduled.  Do not eat food after midnight the night before surgery.  No gum chewing, lozengers or hard candies.  You may however, drink CLEAR liquids up to 2 hours before you are scheduled to arrive for your surgery. Do not drink anything within 2 hours of your scheduled arrival time.  Clear liquids include: - water  - apple juice without pulp - gatorade (not RED colors) - black coffee or tea (Do NOT add milk or creamers to the coffee or tea) Do NOT drink anything that is not on this list.  TAKE THESE MEDICATIONS THE MORNING OF SURGERY WITH A SIP OF WATER: NONE  Fleets Enema - 1 on the night before surgery and 1 two hours on the morning of surgery until clear.  One week prior to surgery: Stop Anti-inflammatories (NSAIDS) such as Advil, Aleve, Ibuprofen, Motrin, Naproxen, Naprosyn and Aspirin based products such as Excedrin, Goodys Powder, BC Powder.  Stop ANY OVER THE COUNTER supplements until after surgery.  You may however, continue to take Tylenol if needed for pain up until the day of surgery.  No Alcohol for 24 hours before or after surgery.  No Smoking including e-cigarettes for 24 hours prior to surgery.  No chewable tobacco products for at least 6 hours prior to surgery.  No nicotine patches on the day of surgery.  Do not use any "recreational" drugs for at least a week prior to your surgery.  Please be advised that the  combination of cocaine and anesthesia may have negative outcomes, up to and including death. If you test positive for cocaine, your surgery will be cancelled.  On the morning of surgery brush your teeth with toothpaste and water, you may rinse your mouth with mouthwash if you wish. Do not swallow any toothpaste or mouthwash.  Do not wear jewelry, make-up, hairpins, clips or nail polish.  Do not wear lotions, powders, or perfumes.   Do not shave body from the neck down 48 hours prior to surgery just in case you cut yourself which could leave a site for infection.  Also, freshly shaved skin may become irritated if using the CHG soap.  Contact lenses, hearing aids and dentures may not be worn into surgery.  Do not bring valuables to the hospital. [facility] is not responsible for any missing/lost belongings or valuables.   Fleets enema or bowel prep as directed.  Notify your doctor if there is any change in your medical condition (cold, fever, infection).  Wear comfortable clothing (specific to your surgery type) to the hospital.  After surgery, you can help prevent lung complications by doing breathing exercises.  Take deep breaths and cough every 1-2 hours. Your doctor may order a device called an Incentive Spirometer to help you take deep breaths. When coughing or sneezing, hold a pillow firmly against your incision with both hands. This is called "splinting." Doing this helps protect your incision. It also decreases  belly discomfort.  If you are being admitted to the hospital overnight, leave your suitcase in the car. After surgery it may be brought to your room.  If you are being discharged the day of surgery, you will not be allowed to drive home. You will need a responsible adult (18 years or older) to drive you home and stay with you that night.   If you are taking public transportation, you will need to have a responsible adult (18 years or older) with you. Please confirm  with your physician that it is acceptable to use public transportation.   Please call the [facility] at [phone] if you have any questions about these instructions.  Surgery Visitation Policy:  Patients undergoing a surgery or procedure may have two family members or support persons with them as long as the person is not COVID-19 positive or experiencing its symptoms.   Inpatient Visitation:    Visiting hours are 7 a.m. to 8 p.m. Up to four visitors are allowed at one time in a patient room, including children. The visitors may rotate out with other people during the day. One designated support person (adult) may remain overnight.

## 2021-11-25 NOTE — H&P (Signed)
NAME: Jason Sloan, CARRICO MEDICAL RECORD NO: [id number] ACCOUNT NO: [id number] DATE OF BIRTH: 03/29/1955 FACILITY: [facility] LOCATION: [facility] PHYSICIAN: Otelia Limes. Yves Dill, MD  History and Physical   DATE OF ADMISSION: 12/02/2021  Same day surgery is 12/02/2021.  CHIEF COMPLAINT:  Prostate cancer.  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  He had a fusion biopsy in October 2022.  At that time prostate was over 58 grams and he has been downsized  with finasteride and Erleada.  Most recent ultrasound of the prostate revealed a volume of 38.7 mL on July 18th.  Most recent PSA was 3.3 ng/mL on 08/31/2021.  Prior to initiating finasteride and Erleada PSA was 8.3 ng/mL.  He has had significant  improvement of his voiding symptoms.  IPSS score was 7 with a quality of life score of 2.  He comes in now for high-intensity focused ultrasound treatment of the prostate.  PAST MEDICAL HISTORY: ALLERGIES:  No drug allergies.  CURRENT MEDICATIONS:  Finasteride and Erleada.  PAST SURGICAL HISTORY:  1.  Lumbar laminectomy in 1990. 2.  Right knee surgery 2014. 3.  Left knee surgery 2018.  PAST AND CURRENT MEDICAL CONDITIONS AND REVIEW OF SYSTEMS:  The patient denied chest pain, shortness of breath, diabetes, stroke or heart disease.  SOCIAL HISTORY:  The patient denied tobacco or alcohol use.  FAMILY HISTORY:  Father died at age 45 of stomach cancer.  Mother is living, age 70 with heart disease.  PHYSICAL EXAMINATION:   VITAL SIGNS:  Height was 6 feet 4 inches, weight 224 pounds, BMI 27. GENERAL:  Well-nourished white male in no acute distress. HEENT:  Sclerae were clear.  Pupils are equally round, reactive to light and accommodation.  Extraocular movements are intact. NECK:  No palpable masses or tenderness.  No audible carotid bruits. LYMPHATIC:  No palpable cervical or inguinal  adenopathy. PULMONARY:  Lungs are clear to auscultation. CARDIOVASCULAR:  Regular rhythm and rate without audible murmurs. ABDOMEN:  Soft, nontender abdomen.  No CVA tenderness. GENITOURINARY:  Circumcised.  Testes were smooth, nontender, approximately 20 mL in size each. RECTAL:  30 gram smooth, nontender prostate. NEUROMUSCULAR:  Alert and oriented x3.  Nonfocal.  IMPRESSION: 1.  Stage T1c, Gleason grade 3+3 adenocarcinoma of the prostate. 2.  Benign prostatic hypertrophy.  PLAN:  High-intensity focused ultrasound treatment.   PUS D: 11/25/2021 2:29:44 pm T: 11/25/2021 2:52:00 pm  JOB: [id number]/ [id number]

## 2021-12-02 ENCOUNTER — Ambulatory Visit: Payer: Medicare HMO | Admitting: Urgent Care

## 2021-12-02 ENCOUNTER — Encounter: Payer: Self-pay | Admitting: Urology

## 2021-12-02 ENCOUNTER — Ambulatory Visit
Admission: RE | Admit: 2021-12-02 | Discharge: 2021-12-02 | Disposition: A | Discharge status: Home / Self Care | Payer: Medicare HMO | Attending: Urology | Admitting: Urology

## 2021-12-02 ENCOUNTER — Encounter
Admission: RE | Disposition: A | Discharge status: Home / Self Care | Payer: Self-pay | Source: Home / Self Care | Attending: Urology

## 2021-12-02 ENCOUNTER — Other Ambulatory Visit: Payer: Self-pay

## 2021-12-02 DIAGNOSIS — C61 Malignant neoplasm of prostate: Secondary | ICD-10-CM | POA: Diagnosis present

## 2021-12-02 DIAGNOSIS — Z86718 Personal history of other venous thrombosis and embolism: Secondary | ICD-10-CM | POA: Diagnosis not present

## 2021-12-02 DIAGNOSIS — N400 Enlarged prostate without lower urinary tract symptoms: Secondary | ICD-10-CM | POA: Insufficient documentation

## 2021-12-02 DIAGNOSIS — I440 Atrioventricular block, first degree: Secondary | ICD-10-CM | POA: Insufficient documentation

## 2021-12-02 HISTORY — PX: HIGH INTENSITY FOCUSED ULTRASOUND (HIFU) OF THE PROSTATE: SHX6793

## 2021-12-02 SURGERY — ABLATION, PROSTATE, RECTAL APPROACH, USING HIGH-INTENSITY FOCUSED ULTRASOUND
Anesthesia: General

## 2021-12-02 MED ORDER — ONDANSETRON HCL 4 MG/2ML IJ SOLN
INTRAMUSCULAR | Status: DC | PRN
  Administered 2021-12-02: 4 mg via INTRAVENOUS

## 2021-12-02 MED ORDER — LACTATED RINGERS IV SOLN: INTRAVENOUS | Status: DC

## 2021-12-02 MED ORDER — LIDOCAINE HCL (PF) 2 % IJ SOLN
INTRAMUSCULAR | Status: AC
  Filled 2021-12-02: qty 5

## 2021-12-02 MED ORDER — LEVOFLOXACIN IN D5W 500 MG/100ML IV SOLN
500.0000 mg | Freq: Once | INTRAVENOUS | Status: AC
  Administered 2021-12-02: 500 mg via INTRAVENOUS

## 2021-12-02 MED ORDER — CHLORHEXIDINE GLUCONATE 0.12 % MT SOLN: 15.0000 mL | Freq: Once | OROMUCOSAL | Status: AC

## 2021-12-02 MED ORDER — FAMOTIDINE 20 MG PO TABS
ORAL_TABLET | ORAL | Status: AC
  Administered 2021-12-02: 20 mg via ORAL
  Filled 2021-12-02: qty 1

## 2021-12-02 MED ORDER — ROCURONIUM BROMIDE 100 MG/10ML IV SOLN
INTRAVENOUS | Status: DC | PRN
  Administered 2021-12-02: 70 mg via INTRAVENOUS
  Administered 2021-12-02: 30 mg via INTRAVENOUS

## 2021-12-02 MED ORDER — FENTANYL CITRATE (PF) 100 MCG/2ML IJ SOLN
INTRAMUSCULAR | Status: DC | PRN
  Administered 2021-12-02: 50 ug via INTRAVENOUS
  Administered 2021-12-02 (×2): 25 ug via INTRAVENOUS

## 2021-12-02 MED ORDER — DROPERIDOL 2.5 MG/ML IJ SOLN: 0.6250 mg | Freq: Once | INTRAMUSCULAR | Status: DC | PRN

## 2021-12-02 MED ORDER — FAMOTIDINE 20 MG PO TABS: 20.0000 mg | ORAL_TABLET | Freq: Once | ORAL | Status: AC

## 2021-12-02 MED ORDER — GLYCOPYRROLATE 0.2 MG/ML IJ SOLN
INTRAMUSCULAR | Status: DC | PRN
  Administered 2021-12-02: .2 mg via INTRAVENOUS

## 2021-12-02 MED ORDER — PROPOFOL 10 MG/ML IV BOLUS
INTRAVENOUS | Status: AC
  Filled 2021-12-02: qty 20

## 2021-12-02 MED ORDER — SUGAMMADEX SODIUM 200 MG/2ML IV SOLN
INTRAVENOUS | Status: DC | PRN
  Administered 2021-12-02: 400 mg via INTRAVENOUS

## 2021-12-02 MED ORDER — DEXAMETHASONE SODIUM PHOSPHATE 10 MG/ML IJ SOLN
INTRAMUSCULAR | Status: DC | PRN
  Administered 2021-12-02: 10 mg via INTRAVENOUS

## 2021-12-02 MED ORDER — ACETAMINOPHEN 10 MG/ML IV SOLN: 1000.0000 mg | Freq: Once | INTRAVENOUS | Status: DC | PRN

## 2021-12-02 MED ORDER — ONDANSETRON HCL 4 MG/2ML IJ SOLN
INTRAMUSCULAR | Status: AC
  Filled 2021-12-02: qty 2

## 2021-12-02 MED ORDER — FENTANYL CITRATE (PF) 100 MCG/2ML IJ SOLN: 25.0000 ug | INTRAMUSCULAR | Status: DC | PRN

## 2021-12-02 MED ORDER — OXYCODONE HCL 5 MG/5ML PO SOLN: 5.0000 mg | Freq: Once | ORAL | Status: AC | PRN

## 2021-12-02 MED ORDER — LIDOCAINE HCL (CARDIAC) PF 100 MG/5ML IV SOSY
PREFILLED_SYRINGE | INTRAVENOUS | Status: DC | PRN
  Administered 2021-12-02: 100 mg via INTRAVENOUS

## 2021-12-02 MED ORDER — ACETAMINOPHEN 10 MG/ML IV SOLN
INTRAVENOUS | Status: DC | PRN
  Administered 2021-12-02: 1000 mg via INTRAVENOUS

## 2021-12-02 MED ORDER — FENTANYL CITRATE (PF) 100 MCG/2ML IJ SOLN
INTRAMUSCULAR | Status: AC
  Filled 2021-12-02: qty 2

## 2021-12-02 MED ORDER — EPHEDRINE SULFATE (PRESSORS) 50 MG/ML IJ SOLN
INTRAMUSCULAR | Status: DC | PRN
  Administered 2021-12-02 (×2): 5 mg via INTRAVENOUS

## 2021-12-02 MED ORDER — PROPOFOL 10 MG/ML IV BOLUS
INTRAVENOUS | Status: DC | PRN
  Administered 2021-12-02: 120 mg via INTRAVENOUS

## 2021-12-02 MED ORDER — ORAL CARE MOUTH RINSE: 15.0000 mL | Freq: Once | OROMUCOSAL | Status: AC

## 2021-12-02 MED ORDER — MIDAZOLAM HCL 2 MG/2ML IJ SOLN
INTRAMUSCULAR | Status: DC | PRN
  Administered 2021-12-02: 2 mg via INTRAVENOUS

## 2021-12-02 MED ORDER — PROMETHAZINE HCL 25 MG/ML IJ SOLN: 6.2500 mg | INTRAMUSCULAR | Status: DC | PRN

## 2021-12-02 MED ORDER — CHLORHEXIDINE GLUCONATE 0.12 % MT SOLN
OROMUCOSAL | Status: AC
  Administered 2021-12-02: 15 mL via OROMUCOSAL
  Filled 2021-12-02: qty 15

## 2021-12-02 MED ORDER — SUGAMMADEX SODIUM 500 MG/5ML IV SOLN
INTRAVENOUS | Status: AC
  Filled 2021-12-02: qty 5

## 2021-12-02 MED ORDER — PHENYLEPHRINE 80 MCG/ML (10ML) SYRINGE FOR IV PUSH (FOR BLOOD PRESSURE SUPPORT)
PREFILLED_SYRINGE | INTRAVENOUS | Status: DC | PRN
  Administered 2021-12-02: 80 ug via INTRAVENOUS

## 2021-12-02 MED ORDER — PROPOFOL 1000 MG/100ML IV EMUL
INTRAVENOUS | Status: AC
  Filled 2021-12-02: qty 100

## 2021-12-02 MED ORDER — DEXAMETHASONE SODIUM PHOSPHATE 10 MG/ML IJ SOLN
INTRAMUSCULAR | Status: AC
  Filled 2021-12-02: qty 1

## 2021-12-02 MED ORDER — PHENYLEPHRINE 80 MCG/ML (10ML) SYRINGE FOR IV PUSH (FOR BLOOD PRESSURE SUPPORT)
PREFILLED_SYRINGE | INTRAVENOUS | Status: AC
  Filled 2021-12-02: qty 10

## 2021-12-02 MED ORDER — OXYCODONE HCL 5 MG PO TABS
ORAL_TABLET | ORAL | Status: AC
  Filled 2021-12-02: qty 1

## 2021-12-02 MED ORDER — LEVOFLOXACIN IN D5W 500 MG/100ML IV SOLN
INTRAVENOUS | Status: AC
  Filled 2021-12-02: qty 100

## 2021-12-02 MED ORDER — OXYCODONE HCL 5 MG PO TABS
5.0000 mg | ORAL_TABLET | Freq: Once | ORAL | Status: AC | PRN
  Administered 2021-12-02: 5 mg via ORAL

## 2021-12-02 MED ORDER — EPHEDRINE 5 MG/ML INJ
INTRAVENOUS | Status: AC
  Filled 2021-12-02: qty 5

## 2021-12-02 MED ORDER — ROCURONIUM BROMIDE 10 MG/ML (PF) SYRINGE
PREFILLED_SYRINGE | INTRAVENOUS | Status: AC
  Filled 2021-12-02: qty 10

## 2021-12-02 MED ORDER — ACETAMINOPHEN 10 MG/ML IV SOLN
INTRAVENOUS | Status: AC
  Filled 2021-12-02: qty 100

## 2021-12-02 MED ORDER — GLYCOPYRROLATE 0.2 MG/ML IJ SOLN
INTRAMUSCULAR | Status: AC
  Filled 2021-12-02: qty 1

## 2021-12-02 MED ORDER — MIDAZOLAM HCL 2 MG/2ML IJ SOLN
INTRAMUSCULAR | Status: AC
  Filled 2021-12-02: qty 2

## 2021-12-02 SURGICAL SUPPLY — 23 items
CATH FOL 2WAY LX 16X5 (CATHETERS) IMPLANT
FEE RENTAL SONABLATE (MISCELLANEOUS) ×1 IMPLANT
FEE SONABLATE RENTAL (MISCELLANEOUS) ×1 IMPLANT
FEE SONABLATE TECHNICIAN (MISCELLANEOUS) ×1 IMPLANT
FEE TECHNICIAN SONABLATE (MISCELLANEOUS) ×1 IMPLANT
GAUZE 4X4 16PLY ~~LOC~~+RFID DBL (SPONGE) ×2 IMPLANT
GLOVE BIO SURGEON STRL SZ7.5 (GLOVE) ×1 IMPLANT
GOWN STRL REUS W/ TWL LRG LVL3 (GOWN DISPOSABLE) IMPLANT
GOWN STRL REUS W/ TWL XL LVL3 (GOWN DISPOSABLE) IMPLANT
GOWN STRL REUS W/TWL LRG LVL3 (GOWN DISPOSABLE)
GOWN STRL REUS W/TWL XL LVL3 (GOWN DISPOSABLE)
HOLDER FOLEY CATH W/STRAP (MISCELLANEOUS) ×1 IMPLANT
KIT TURNOVER KIT A (KITS) ×1 IMPLANT
LABEL OR SOLS (LABEL) ×1 IMPLANT
MANIFOLD NEPTUNE II (INSTRUMENTS) IMPLANT
PACK CYSTO AR (MISCELLANEOUS) ×1 IMPLANT
PACK PROSTATE SONABLATE INSERT (MISCELLANEOUS) ×1 IMPLANT
SYR 10ML LL (SYRINGE) IMPLANT
SYR TOOMEY 50ML (SYRINGE) IMPLANT
TRAP FLUID SMOKE EVACUATOR (MISCELLANEOUS) ×1 IMPLANT
TRAY FOLEY MTR SLVR 16FR STAT (SET/KITS/TRAYS/PACK) ×1 IMPLANT
TUBING CONNECTING 10 (TUBING) IMPLANT
WATER STERILE IRR 1000ML POUR (IV SOLUTION) ×1 IMPLANT

## 2021-12-02 NOTE — Op Note (Signed)
Preoperative diagnosis: Prostate cancer (C61)  Postoperative diagnosis: Same  Procedure: High intensity focused ultrasound treatment (CPT [phone])  Surgeon: Otelia Limes. 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  He had a fusion biopsy in October 2022.  At that time prostate was over 58 grams and he has been downsized with finasteride and Erleada.  Most recent ultrasound of the prostate revealed a volume of 38.7 mL on July 18th.  Most recent PSA was 3.3 ng/mL on 08/31/2021.  Prior to initiating finasteride and Erleada PSA was 8.3 ng/mL.  He has had significant improvement of his voiding symptoms.  IPSS score was 7 with a quality of life score of 2.  He comes in now for high-intensity focused ultrasound treatment of the prostate.  After informed consent the above procedure(s) were requested.     Technique and findings: The patient, identified by his name bracelet, was taken to the procedure room. After a standard timeout was performed, the staff that were present agreed on the procedure to be performed. The patient was anesthetized, prepped and draped in the usual manner. A urethral catheter was placed and the catheter connected to closed system gravity drainage. The patient was placed in the lithotomy position with padded stirrups and anti-embolism compression devices applied. The trans-rectal Sonablate probe was carefully  inserted and the prostate was measured with the dimensions noted below. The dimensions of the prostate were:  H _3.2_ cm W  4.47_ cm L 3.78 cm, for a calculated volume of 28.01_cc.  Images in both transverse and sagittal views were obtained. The urethral catheter was not removed. A treatment plan was programmed. The power levels were selected based on approximate rectal wall distance as outlined in the Physician Instruction  Manual. The temperature of the rectal probe and the reflectivity index were maintained within normal range at all times. The targeted area of the prostate gland was treated in its entirety.  The prostate was treated in 3 complete zones and with 1 drop zone. The procedure was recorded on the [employer].  The patient tolerated procedure well and was transferred to the recovery room in stable condition.

## 2021-12-02 NOTE — Discharge Instructions (Addendum)
Indwelling Urinary Catheter Care, Adult An indwelling urinary catheter is a thin tube that is put into your bladder. The tube helps to drain pee (urine) out of your body. The tube goes in through your urethra. Your urethra is where pee comes out of your body. Your pee will come out through the catheter, then it will go into a bag (drainage bag). Take good care of your catheter so it will work well. What are the risks? Germs may get into your bladder and cause an infection. The tube can become blocked. Tissue near the catheter may become irritated and may bleed. How to wear your catheter and drainage bag Supplies needed Sticky tape (adhesive tape) or a leg strap. Alcohol wipe or soap and water (if you use tape). A clean towel (if you use tape). Large overnight bag. Smaller bag (leg bag). Wearing your catheter Attach your catheter to your leg with tape or a leg strap. Make sure the catheter is not pulled tight. If a leg strap gets wet, take it off and put on a dry strap. If you use tape to hold the bag on your leg: Use an alcohol wipe or soap and water to wash your skin where the tape made it sticky before. Use a clean towel to pat-dry that skin. Use new tape to make the bag stay on your leg. Wearing your bags You should have been given a large overnight bag. You may wear the overnight bag in the day or night. Always have the overnight bag lower than your bladder.  Do not let the bag touch the floor. Before you go to sleep, put a clean plastic bag in a wastebasket. Then, hang the overnight bag inside the wastebasket. You should also have a smaller leg bag that fits under your clothes. Wear the leg bag as told by the product maker. This may be above or below the knee, depending on the length of the tubing. Make sure that the leg bag is below the bladder. Make sure that the tubing does not have loops or too much tension. Do not wear your leg bag at night. How to care for your skin and  catheter Supplies needed A clean washcloth. Water and mild soap. A clean towel. Caring for your skin and catheter     Clean the skin around your catheter every day. Wash your hands with soap and water. Wet a clean washcloth in warm water and mild soap. Clean the skin around your urethra. If you are male: Gently spread the folds of skin around your vagina (labia). With the washcloth in your other hand, wipe the inner side of your labia on each side. Wipe from front to back. If you are male: Pull back any skin that covers the end of your penis (foreskin). With the washcloth in your other hand, wipe your penis in small circles. Start wiping at the tip of your penis, then move away from the catheter. Move the foreskin back in place, if needed. With your free hand, hold the catheter close to where it goes into your body. Keep holding the catheter during cleaning so it does not get pulled out. With the washcloth in your other hand, clean the catheter. Only wipe downward on the catheter, toward the drainage bag. Do not wipe upward toward your body. Doing this may push germs into your urethra and cause infection. Use a clean towel to pat-dry the catheter and the skin around it. Make sure to wipe off all soap. Wash  your hands with soap and water. Shower every day. Do not take baths. Do not use cream, ointment, or lotion on the area where the catheter goes into your body, unless your doctor tells you to. Do not use powders, sprays, or lotions on your genital area. Check your skin around the catheter every day for signs of infection. Check for: Redness, swelling, or pain. Fluid or blood. Warmth. Pus or a bad smell. How to empty the bag Supplies needed Rubbing alcohol. Gauze pad or cotton ball. Tape or a leg strap. Emptying the bag Pour the pee out of your bag when it is ?- full, or at least 2-3 times a day. Do this for your overnight bag and your leg bag. Wash your hands with soap  and water. Separate (detach) the bag from your leg. Hold the bag over the toilet or a clean pail. Keep the bag lower than your hips and bladder. This is so the pee (urine) does not go back into the tube. Open the pour spout. It is at the bottom of the bag. Empty the pee into the toilet or pail. Do not let the pour spout touch any surface. Put rubbing alcohol on a gauze pad or cotton ball. Use the gauze pad or cotton ball to clean the pour spout. Close the pour spout. Attach the bag to your leg with tape or a leg strap. Wash your hands with soap and water. Follow instructions for cleaning the drainage bag. Instructions can come from: The product maker. Your doctor. How to change the bag Changing the bag Replace your bag when it starts to leak, smell bad, or look dirty. Wash your hands with soap and water. Separate the dirty bag from your leg. Pinch the catheter with your fingers so that pee does not spill out. Separate the catheter tube from the bag tube where these tubes connect (at the connection valve). Do not let the tubes touch any surface. Clean the end of the catheter tube with an alcohol wipe. Use a different alcohol wipe to clean the end of the bag tube. Connect the catheter tube to the tube of the clean bag. Attach the clean bag to your leg with tape or a leg strap. Do not make the bag tight on your leg. Wash your hands with soap and water. General instructions  Never pull on your catheter. Never try to take it out. Doing that can hurt you. Always wash your hands before and after you touch your catheter or bag. Use a mild, fragrance-free soap. If you do not have soap and water, use hand sanitizer. Always make sure there are no twists, bends, or kinks in the catheter tube. Always make sure there are no leaks in the catheter or bag. Drink enough fluid to keep your pee pale yellow. Do not take baths, swim, or use a hot tub. If you are male, wipe from front to back after you  poop (have a bowel movement). Contact a doctor if: Your catheter gets clogged. Your catheter leaks. You have signs of infection at the catheter site, such as: Redness, swelling, or pain where the catheter goes into your body. Fluid, blood, pus, or a bad smell coming from the area where the catheter goes into your body. Skin feels warm where the catheter goes into your body. You have signs of a bladder infection, such as: Fever. Chills. Pee smells worse than usual. Cloudy pee. Pain in your belly, legs, lower back, or bladder. Vomiting or feel like  vomiting. Get help right away if: You see blood in the catheter. Your pee is pink or red. Your bladder feels full. Your pee is not draining into the bag. Your catheter gets pulled out. Summary An indwelling urinary catheter is a thin tube that is placed into the bladder to help drain pee (urine) out of the body. The catheter is placed into the part of the body that drains pee from the bladder (urethra). Taking good care of your catheter will keep it working well. Always wash your hands before and after touching your catheter or bag. Never pull on your catheter or try to take it out. This information is not intended to replace advice given to you by your health care provider. Make sure you discuss any questions you have with your health care provider. Document Revised: 10/01/2020 Document Reviewed: 10/01/2020 Elsevier Patient Education  2023 [employer]  AMBULATORY SURGERY  DISCHARGE INSTRUCTIONS   The drugs that you were given will stay in your system until tomorrow so for the next 24 hours you should not:  Drive an automobile Make any legal decisions Drink any alcoholic beverage   You may resume regular meals tomorrow.  Today it is better to start with liquids and gradually work up to solid foods.  You may eat anything you prefer, but it is better to start with liquids, then soup and crackers, and gradually work up to solid  foods.   Please notify your doctor immediately if you have any unusual bleeding, trouble breathing, redness and pain at the surgery site, drainage, fever, or pain not relieved by medication.    Additional Instructions:        Please contact your physician with any problems or Same Day Surgery at [phone], Monday through Friday 6 am to 4 pm, or [facility] Main number at [phone]. 

## 2021-12-02 NOTE — Anesthesia Preprocedure Evaluation (Addendum)
Anesthesia Evaluation  Patient identified by MRN, date of birth, ID band Patient awake    Reviewed: Allergy & Precautions, NPO status , Patient's Chart, lab work & pertinent test results  History of Anesthesia Complications Negative for: history of anesthetic complications  Airway Mallampati: III   Neck ROM: Full    Dental no notable dental hx.    Pulmonary neg pulmonary ROS,    Pulmonary exam normal breath sounds clear to auscultation       Cardiovascular Exercise Tolerance: Good + DVT (after knee surgery, no longer on anticoagulation)  Normal cardiovascular exam Rhythm:Regular Rate:Normal     Neuro/Psych negative neurological ROS     GI/Hepatic negative GI ROS,   Endo/Other  negative endocrine ROS  Renal/GU negative Renal ROS     Musculoskeletal   Abdominal   Peds  Hematology negative hematology ROS (+)   Anesthesia Other Findings EKG: Sinus bradycardia with 1st degree A-V block Rightward axis Possible Anterior infarct , age undetermined Abnormal ECG No previous ECGs available Confirmed by Neoma Laming [phone]) on 11/23/2021 2:00:04 PM  Reproductive/Obstetrics                            Anesthesia Physical  Anesthesia Plan  ASA: 2  Anesthesia Plan: General   Post-op Pain Management: Minimal or no pain anticipated   Induction: Intravenous  PONV Risk Score and Plan: 2 and Dexamethasone, Ondansetron and Midazolam  Airway Management Planned: Oral ETT  Additional Equipment:   Intra-op Plan:   Post-operative Plan: Extubation in OR  Informed Consent: I have reviewed the patients History and Physical, chart, labs and discussed the procedure including the risks, benefits and alternatives for the proposed anesthesia with the patient or authorized representative who has indicated his/her understanding and acceptance.     Dental advisory given  Plan Discussed with:  CRNA  Anesthesia Plan Comments:        Anesthesia Quick Evaluation

## 2021-12-02 NOTE — Anesthesia Postprocedure Evaluation (Addendum)
Anesthesia Post Note  Patient: Jason Sloan  Procedure(s) Performed: HIGH INTENSITY FOCUSED ULTRASOUND (HIFU) OF THE PROSTATE  Patient location during evaluation: PACU Anesthesia Type: General Level of consciousness: awake and alert Pain management: pain level controlled Vital Signs Assessment: post-procedure vital signs reviewed and stable Respiratory status: spontaneous breathing, nonlabored ventilation and respiratory function stable Cardiovascular status: blood pressure returned to baseline and stable Postop Assessment: no apparent nausea or vomiting Anesthetic complications: no   No notable events documented.   Last Vitals:  Vitals:   12/02/21 1415 12/02/21 1427  BP: 132/61   Pulse: (!) 43   Resp: 14   Temp: (!) 36.1 C 36.6 C  SpO2: 98%     Last Pain:  Vitals:   12/02/21 1427  TempSrc: Temporal  PainSc:                  [place] Ouch

## 2021-12-02 NOTE — H&P (Signed)
Date of Initial H&P: 11/25/21  History reviewed, patient examined, no change in status, stable for surgery.   

## 2021-12-02 NOTE — Transfer of Care (Signed)
Immediate Anesthesia Transfer of Care Note  Patient: Jason Sloan  Procedure(s) Performed: HIGH INTENSITY FOCUSED ULTRASOUND (HIFU) OF THE PROSTATE  Patient Location: PACU  Anesthesia Type:General  Level of Consciousness: drowsy  Airway & Oxygen Therapy: Patient Spontanous Breathing and Patient connected to face mask oxygen  Post-op Assessment: Report given to RN and Post -op Vital signs reviewed and stable  Post vital signs: Reviewed and stable  Last Vitals:  Vitals Value Taken Time  BP 144/73 12/02/21 1315  Temp 36 C 12/02/21 1310  Pulse 41 12/02/21 1316  Resp 12 12/02/21 1316  SpO2 100 % 12/02/21 1316  Vitals shown include unvalidated device data.  Last Pain:  Vitals:   12/02/21 1310  TempSrc:   PainSc: Asleep         Complications: No notable events documented.

## 2021-12-02 NOTE — Anesthesia Procedure Notes (Signed)
Procedure Name: Intubation Date/Time: 12/02/2021 10:55 AM  Performed by: Cammie Sickle, CRNAPre-anesthesia Checklist: Patient identified, Patient being monitored, Timeout performed, Emergency Drugs available and Suction available Patient Re-evaluated:Patient Re-evaluated prior to induction Oxygen Delivery Method: Circle system utilized Preoxygenation: Pre-oxygenation with 100% oxygen Induction Type: IV induction Ventilation: Mask ventilation without difficulty Laryngoscope Size: 3 and McGraph Grade View: Grade I Tube type: Oral Tube size: 7.5 mm Number of attempts: 1 Airway Equipment and Method: Stylet Placement Confirmation: ETT inserted through vocal cords under direct vision, positive ETCO2 and breath sounds checked- equal and bilateral Secured at: 22 cm Tube secured with: Tape Dental Injury: Teeth and Oropharynx as per pre-operative assessment

## 2021-12-03 ENCOUNTER — Encounter: Payer: Self-pay | Admitting: Urology

## 2021-12-08 ENCOUNTER — Emergency Department (HOSPITAL_BASED_OUTPATIENT_CLINIC_OR_DEPARTMENT_OTHER)
Admission: EM | Admit: 2021-12-08 | Discharge: 2021-12-08 | Disposition: A | Discharge status: Home / Self Care | Payer: Medicare HMO | Attending: Emergency Medicine | Admitting: Emergency Medicine

## 2021-12-08 ENCOUNTER — Other Ambulatory Visit: Payer: Self-pay

## 2021-12-08 ENCOUNTER — Encounter (HOSPITAL_BASED_OUTPATIENT_CLINIC_OR_DEPARTMENT_OTHER): Payer: Self-pay | Admitting: Urology

## 2021-12-08 DIAGNOSIS — R339 Retention of urine, unspecified: Secondary | ICD-10-CM | POA: Insufficient documentation

## 2021-12-08 DIAGNOSIS — Z8546 Personal history of malignant neoplasm of prostate: Secondary | ICD-10-CM | POA: Insufficient documentation

## 2021-12-08 DIAGNOSIS — R338 Other retention of urine: Secondary | ICD-10-CM

## 2021-12-08 DIAGNOSIS — R1030 Lower abdominal pain, unspecified: Secondary | ICD-10-CM | POA: Diagnosis not present

## 2021-12-08 LAB — URINALYSIS, ROUTINE W REFLEX MICROSCOPIC
Bilirubin Urine: NEGATIVE
Glucose, UA: NEGATIVE mg/dL
Ketones, ur: NEGATIVE mg/dL
Leukocytes,Ua: NEGATIVE
Nitrite: NEGATIVE
Protein, ur: NEGATIVE mg/dL
Specific Gravity, Urine: 1.02 (ref 1.005–1.030)
pH: 5.5 (ref 5.0–8.0)

## 2021-12-08 LAB — URINALYSIS, MICROSCOPIC (REFLEX): WBC, UA: NONE SEEN WBC/hpf (ref 0–5)

## 2021-12-08 NOTE — ED Notes (Signed)
Leg bag provided for pt; pt will take regular foley bag as well for night time use

## 2021-12-08 NOTE — Discharge Instructions (Signed)
You were seen in the emergency department for being unable to urinate after having your catheter removed at urology.  A Foley catheter was placed with improvement in your symptoms.  Please continue your catheter and call your urologist tomorrow for further instructions.  Monitor for any fevers.  Return if any concerns.

## 2021-12-08 NOTE — ED Provider Notes (Signed)
[facility] Provider Note   CSN: [id number] Arrival date & time: 12/08/21  2108     History {Add pertinent medical, surgical, social history, OB history to HPI:1} No chief complaint on file.   Jason Sloan is a 66 y.o. male.  He has a history of prostate cancer and recently had a prostate procedure.  At urology today had his Foley catheter removed and was able to urinate a little bit.  Since this evening is having severe lower abdominal pain and unable to urinate.  He was sent home with some catheters and his son attempted to catheterize him without success and caused some bleeding.  No fevers.  The history is provided by the patient.  Abdominal Pain Pain location:  Suprapubic Pain quality: aching   Pain radiates to:  Perineum Pain severity:  Severe Onset quality:  Gradual Timing:  Constant Progression:  Worsening Chronicity:  New Relieved by:  Nothing Worsened by:  Nothing Ineffective treatments: Catheter placement. Associated symptoms: hematuria   Associated symptoms: no fever, no nausea, no shortness of breath and no vomiting        Home Medications Prior to Admission medications   Medication Sig Start Date End Date Taking? Authorizing Provider  finasteride (PROSCAR) 5 MG tablet Take 5 mg by mouth daily.    [provider]      Allergies    Penicillins    Review of Systems   Review of Systems  Constitutional:  Negative for fever.  Respiratory:  Negative for shortness of breath.   Gastrointestinal:  Positive for abdominal pain. Negative for nausea and vomiting.  Genitourinary:  Positive for difficulty urinating and hematuria.    Physical Exam Updated Vital Signs BP (!) 140/97 (BP Location: Right Arm)   Pulse 69   Temp 98 F (36.7 C)   Resp 20   Ht '6\' 4"'$  (1.93 m)   Wt 99.8 kg   SpO2 100%   BMI 26.78 kg/m  Physical Exam Vitals and nursing note reviewed.  Constitutional:      General: He is in acute distress.      Appearance: Normal appearance. He is well-developed. He is diaphoretic.  HENT:     Head: Normocephalic and atraumatic.  Eyes:     Conjunctiva/sclera: Conjunctivae normal.  Cardiovascular:     Rate and Rhythm: Normal rate and regular rhythm.     Heart sounds: No murmur heard. Pulmonary:     Effort: Pulmonary effort is normal. No respiratory distress.     Breath sounds: Normal breath sounds.  Abdominal:     Palpations: Abdomen is soft.     Tenderness: There is abdominal tenderness. There is no guarding or rebound.  Genitourinary:    Penis: Normal.   Musculoskeletal:     Cervical back: Neck supple.  Skin:    General: Skin is warm.     Capillary Refill: Capillary refill takes less than 2 seconds.  Neurological:     Mental Status: He is alert.     ED Results / Procedures / Treatments   Labs (all labs ordered are listed, but only abnormal results are displayed) Labs Reviewed  URINE CULTURE  URINALYSIS, ROUTINE W REFLEX MICROSCOPIC    EKG None  Radiology No results found.  Procedures Procedures  {Document cardiac monitor, telemetry assessment procedure when appropriate:1}  Medications Ordered in ED Medications - No data to display  ED Course/ Medical Decision Making/ A&P  Medical Decision Making Amount and/or Complexity of Data Reviewed Labs: ordered.   This patient complains of ***; this involves an extensive number of treatment Options and is a complaint that carries with it a high risk of complications and morbidity. The differential includes ***  I ordered, reviewed and interpreted labs, which included *** I ordered medication *** and reviewed PMP when indicated. I ordered imaging studies which included *** and I independently    visualized and interpreted imaging which showed *** Additional history obtained from *** Previous records obtained and reviewed *** I consulted *** and discussed lab and imaging findings and  discussed disposition.  Cardiac monitoring reviewed, *** Social determinants considered, *** Critical Interventions: ***  After the interventions stated above, I reevaluated the patient and found *** Admission and further testing considered, ***   {Document critical care time when appropriate:1} {Document review of labs and clinical decision tools ie heart score, Chads2Vasc2 etc:1}  {Document your independent review of radiology images, and any outside records:1} {Document your discussion with family members, caretakers, and with consultants:1} {Document social determinants of health affecting pt's care:1} {Document your decision making why or why not admission, treatments were needed:1} Final Clinical Impression(s) / ED Diagnoses Final diagnoses:  None    Rx / DC Orders ED Discharge Orders     None

## 2021-12-08 NOTE — ED Triage Notes (Signed)
Pt had CA surgery on prostate last Thursday, had catheter removed earlier today, only urine x 1 after cath  States excruciating pain and unable to urinate Son cathed him at home, got no urine return and had bloody return

## 2021-12-10 LAB — URINE CULTURE: Culture: NO GROWTH

## 2021-12-17 ENCOUNTER — Encounter: Payer: Self-pay | Admitting: Gastroenterology

## 2021-12-27 ENCOUNTER — Ambulatory Visit (AMBULATORY_SURGERY_CENTER): Payer: Medicare HMO | Admitting: *Deleted

## 2021-12-27 VITALS — Ht 76.0 in | Wt 227.8 lb

## 2021-12-27 DIAGNOSIS — Z86010 Personal history of colonic polyps: Secondary | ICD-10-CM

## 2021-12-27 MED ORDER — NA SULFATE-K SULFATE-MG SULF 17.5-3.13-1.6 GM/177ML PO SOLN: 1.0000 | Freq: Once | ORAL | 0 refills | Status: AC

## 2021-12-27 NOTE — Progress Notes (Signed)
No egg or soy allergy known to patient  No issues known to pt with past sedation with any surgeries or procedures Patient denies ever being told they had issues or difficulty with intubation  No FH of Malignant Hyperthermia Pt is not on diet pills Pt is not on  home 02  Pt is not on blood thinners  Pt denies issues with constipation  NO A FIB or A flutter Have any cardiac testing pending--NO Pt instructed to use [email or web address].[email or web address] or GoodRx for a price reduction on prep    Patient's chart reviewed by Osvaldo Angst CNRA prior to previsit and patient appropriate for the [place].  Previsit completed and red dot placed by patient's name on their procedure day (on provider's schedule).

## 2022-01-26 ENCOUNTER — Encounter: Payer: Self-pay | Admitting: Gastroenterology

## 2022-03-22 ENCOUNTER — Encounter: Payer: Self-pay | Admitting: Gastroenterology

## 2022-04-01 ENCOUNTER — Encounter: Payer: Self-pay | Admitting: Gastroenterology

## 2022-04-01 ENCOUNTER — Ambulatory Visit (AMBULATORY_SURGERY_CENTER): Payer: Medicare HMO | Admitting: Gastroenterology

## 2022-04-01 VITALS — BP 107/62 | HR 47 | Temp 98.0°F | Resp 15 | Ht 76.0 in | Wt 227.8 lb

## 2022-04-01 DIAGNOSIS — Z86010 Personal history of colonic polyps: Secondary | ICD-10-CM | POA: Diagnosis not present

## 2022-04-01 DIAGNOSIS — Z09 Encounter for follow-up examination after completed treatment for conditions other than malignant neoplasm: Secondary | ICD-10-CM

## 2022-04-01 MED ORDER — SODIUM CHLORIDE 0.9 % IV SOLN: 500.0000 mL | Freq: Once | INTRAVENOUS | Status: DC

## 2022-04-01 NOTE — Op Note (Signed)
[facility] Patient Name: Jason Sloan Procedure Date: 04/01/2022 8:02 AM MRN: [id number] Endoscopist: Justice Britain , MD, [id number] Age: 67 Referring MD:  Date of Birth: April 16, 1955 Gender: Male Account #: [id number] Procedure:                Colonoscopy Indications:              Surveillance: Personal history of adenomatous                            polyps on last colonoscopy > 5 years ago Medicines:                Monitored Anesthesia Care Procedure:                Pre-Anesthesia Assessment:                           - Prior to the procedure, a History and Physical                            was performed, and patient medications and                            allergies were reviewed. The patient's tolerance of                            previous anesthesia was also reviewed. The risks                            and benefits of the procedure and the sedation                            options and risks were discussed with the patient.                            All questions were answered, and informed consent                            was obtained. Prior Anticoagulants: The patient has                            taken no anticoagulant or antiplatelet agents. ASA                            Grade Assessment: II - A patient with mild systemic                            disease. After reviewing the risks and benefits,                            the patient was deemed in satisfactory condition to                            undergo the procedure.  After obtaining informed consent, the colonoscope                            was passed under direct vision. Throughout the                            procedure, the patient's blood pressure, pulse, and                            oxygen saturations were monitored continuously. The                            Olympus CF-HQ190L [id number]) Colonoscope was                            introduced through the  anus and advanced to the 3                            cm into the ileum. The colonoscopy was performed                            without difficulty. The patient tolerated the                            procedure. The quality of the bowel preparation was                            adequate. The terminal ileum, ileocecal valve,                            appendiceal orifice, and rectum were photographed. Scope In: 8:46:26 AM Scope Out: 9:00:28 AM Scope Withdrawal Time: 0 hours 11 minutes 32 seconds  Total Procedure Duration: 0 hours 14 minutes 2 seconds  Findings:                 The digital rectal exam findings include                            hemorrhoids. Pertinent negatives include no                            palpable rectal lesions.                           The terminal ileum and ileocecal valve appeared                            normal.                           Multiple small-mouthed diverticula were found in                            the recto-sigmoid colon, sigmoid colon and  descending colon.                           Normal mucosa was found in the entire colon.                           Non-bleeding non-thrombosed external and internal                            hemorrhoids were found during retroflexion, during                            perianal exam and during digital exam. The                            hemorrhoids were Grade II (internal hemorrhoids                            that prolapse but reduce spontaneously). Complications:            No immediate complications. Estimated Blood Loss:     Estimated blood loss: none. Impression:               - Hemorrhoids found on digital rectal exam.                           - The examined portion of the ileum was normal.                           - Diverticulosis in the recto-sigmoid colon, in the                            sigmoid colon and in the descending colon.                           -  Normal mucosa in the entire examined colon.                           - Non-bleeding non-thrombosed external and internal                            hemorrhoids. Recommendation:           - The patient will be observed post-procedure,                            until all discharge criteria are met.                           - Discharge patient to home.                           - Patient has a contact number available for                            emergencies. The signs and symptoms of potential  delayed complications were discussed with the                            patient. Return to normal activities tomorrow.                            Written discharge instructions were provided to the                            patient.                           - High fiber diet.                           - Continue present medications.                           - Repeat colonoscopy in 7 years for surveillance in                            setting of previous adenomatous polyps history.                           - The findings and recommendations were discussed                            with the patient.                           - The findings and recommendations were discussed                            with the patient's family. Justice Britain, MD 04/01/2022 9:12:34 AM

## 2022-04-01 NOTE — Patient Instructions (Addendum)
Recommendation:           - The patient will be observed post-procedure,                            until all discharge criteria are met.                           - Discharge patient to home.                           - Patient has a contact number available for                            emergencies. The signs and symptoms of potential                            delayed complications were discussed with the                            patient. Return to normal activities tomorrow.                            Written discharge instructions were provided to the                            patient.                           - High fiber diet.                           - Continue present medications.                           - Repeat colonoscopy in 7 years for surveillance in                            setting of previous adenomatous polyps history.                           - The findings and recommendations were discussed                            with the patient.                           - The findings and recommendations were discussed                            with the patient's family.  Handouts on high fiber diet, hemorrhoids and diverticulosis given.  YOU HAD AN ENDOSCOPIC PROCEDURE TODAY AT [facility]:   Refer to the procedure report that was given to you for any specific questions about what was found during the examination.  If the procedure report does not answer your questions, please call your gastroenterologist to clarify.  If you requested that your  care partner not be given the details of your procedure findings, then the procedure report has been included in a sealed envelope for you to review at your convenience later.  YOU SHOULD EXPECT: Some feelings of bloating in the abdomen. Passage of more gas than usual.  Walking can help get rid of the air that was put into your GI tract during the procedure and reduce the bloating. If you had a lower endoscopy (such  as a colonoscopy or flexible sigmoidoscopy) you may notice spotting of blood in your stool or on the toilet paper. If you underwent a bowel prep for your procedure, you may not have a normal bowel movement for a few days.  Please Note:  You might notice some irritation and congestion in your nose or some drainage.  This is from the oxygen used during your procedure.  There is no need for concern and it should clear up in a day or so.  SYMPTOMS TO REPORT IMMEDIATELY:  Following lower endoscopy (colonoscopy or flexible sigmoidoscopy):  Excessive amounts of blood in the stool  Significant tenderness or worsening of abdominal pains  Swelling of the abdomen that is new, acute  Fever of 100F or higher  For urgent or emergent issues, a gastroenterologist can be reached at any hour by calling [phone]. Do not use MyChart messaging for urgent concerns.   DIET:  We do recommend a small meal at first, but then you may proceed to your regular diet.  Drink plenty of fluids but you should avoid alcoholic beverages for 24 hours.  ACTIVITY:  You should plan to take it easy for the rest of today and you should NOT DRIVE or use heavy machinery until tomorrow (because of the sedation medicines used during the test).    FOLLOW UP: Our staff will call the number listed on your records the next business day following your procedure.  We will call around 7:15- 8:00 am to check on you and address any questions or concerns that you may have regarding the information given to you following your procedure. If we do not reach you, we will leave a message.     If any biopsies were taken you will be contacted by phone or by letter within the next 1-3 weeks.  Please call us at [phone] if you have not heard about the biopsies in 3 weeks.    SIGNATURES/CONFIDENTIALITY: You and/or your care partner have signed paperwork which will be entered into your electronic medical record.  These signatures attest to the  fact that that the information above on your After Visit Summary has been reviewed and is understood.  Full responsibility of the confidentiality of this discharge information lies with you and/or your care-partner.

## 2022-04-01 NOTE — Progress Notes (Signed)
Pt's states no medical or surgical changes since previsit or office visit. 

## 2022-04-01 NOTE — Progress Notes (Unsigned)
GASTROENTEROLOGY PROCEDURE H&P NOTE   Primary Care Physician: Christain Sacramento, MD  HPI: Jason Sloan is a 66 y.o. male who presents for Colonoscopy for surveillance in setting of previous adenomatous polyps.  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   Diverticulosis    DVT (deep venous thrombosis) ([place])    Pneumonia    Past Surgical History:  Procedure Laterality Date   BACK SURGERY     lower back   CHONDROPLASTY Left 05/24/2017   Procedure: CHONDROPLASTY;  Surgeon: Earlie Server, MD;  Location: [facility];  Service: Orthopedics;  Laterality: Left;   ELBOW SURGERY Right    FRACTURE SURGERY     rt arm   HIGH INTENSITY FOCUSED ULTRASOUND (HIFU) OF THE PROSTATE N/A 12/02/2021   Procedure: HIGH INTENSITY FOCUSED ULTRASOUND (HIFU) OF THE PROSTATE;  Surgeon: Royston Cowper, MD;  Location: [facility];  Service: Urology;  Laterality: N/A;   KNEE ARTHROSCOPY WITH MEDIAL MENISECTOMY Left 05/24/2017   Procedure: KNEE ARTHROSCOPY WITH MEDIAL MENISECTOMY;  Surgeon: Earlie Server, MD;  Location: [facility];  Service: Orthopedics;  Laterality: Left;   KNEE SURGERY Right    LOBECTOMY Right    due to cryptococcal pneumonia   PROSTATE BIOPSY N/A 11/19/2020   Procedure: PROSTATE BIOPSY Vernelle Emerald;  Surgeon: Royston Cowper, MD;  Location: [facility];  Service: Urology;  Laterality: N/A;   TONSILLECTOMY     Current Outpatient Medications  Medication Sig Dispense Refill   apalutamide (ERLEADA) 60 MG tablet Take 240 mg by mouth daily. (Patient not taking: Reported on 04/01/2022)     docusate sodium (COLACE) 100 MG capsule Take 100 mg by mouth daily. (Patient not taking: Reported on 04/01/2022)     dutasteride (AVODART) 0.5 MG capsule Take 0.5 mg by mouth daily. (Patient not taking: Reported on 12/27/2021)     finasteride (PROSCAR) 5 MG tablet Take 5 mg by mouth daily. (Patient not taking: Reported on 04/01/2022)     tamsulosin  (FLOMAX) 0.4 MG CAPS capsule Take 0.4 mg by mouth daily. (Patient not taking: Reported on 04/01/2022)     Current Facility-Administered Medications  Medication Dose Route Frequency Provider Last Rate Last Admin   0.9 %  sodium chloride infusion  500 mL Intravenous Once Mansouraty, Telford Nab., MD        Current Outpatient Medications:    apalutamide (ERLEADA) 60 MG tablet, Take 240 mg by mouth daily. (Patient not taking: Reported on 04/01/2022), Disp: , Rfl:    docusate sodium (COLACE) 100 MG capsule, Take 100 mg by mouth daily. (Patient not taking: Reported on 04/01/2022), Disp: , Rfl:    dutasteride (AVODART) 0.5 MG capsule, Take 0.5 mg by mouth daily. (Patient not taking: Reported on 12/27/2021), Disp: , Rfl:    finasteride (PROSCAR) 5 MG tablet, Take 5 mg by mouth daily. (Patient not taking: Reported on 04/01/2022), Disp: , Rfl:    tamsulosin (FLOMAX) 0.4 MG CAPS capsule, Take 0.4 mg by mouth daily. (Patient not taking: Reported on 04/01/2022), Disp: , Rfl:   Current Facility-Administered Medications:    0.9 %  sodium chloride infusion, 500 mL, Intravenous, Once, Mansouraty, Telford Nab., MD Allergies  Allergen Reactions   Penicillins     Happened as a child and doesn't remember   Family History  Problem Relation Age of Onset   Stomach cancer Father        ??   Diabetes Maternal Grandfather  Colon cancer Neg Hx    Throat cancer Neg Hx    Pancreatic cancer Neg Hx    Kidney disease Neg Hx    Liver disease Neg Hx    Heart disease Neg Hx    Colon polyps Neg Hx    Crohn's disease Neg Hx    Esophageal cancer Neg Hx    Rectal cancer Neg Hx    Ulcerative colitis Neg Hx    Social History   Socioeconomic History   Marital status: Married    Spouse name: Estill Bamberg   Number of children: Not on file   Years of education: Not on file   Highest education level: Not on file  Occupational History   Not on file  Tobacco Use   Smoking status: Never    Passive exposure: Never   Smokeless  tobacco: Current    Types: Snuff   Tobacco comments:    occasional  Vaping Use   Vaping Use: Never used  Substance and Sexual Activity   Alcohol use: Yes    Alcohol/week: 1.0 standard drink of alcohol    Types: 1 Cans of beer per week    Comment: very rarely   Drug use: No   Sexual activity: Never    Birth control/protection: None  Other Topics Concern   Not on file  Social History Narrative   Not on file   Social Determinants of Health   Financial Resource Strain: Not on file  Food Insecurity: Not on file  Transportation Needs: Not on file  Physical Activity: Not on file  Stress: Not on file  Social Connections: Not on file  Intimate Partner Violence: Not on file    Physical Exam: Today's Vitals   04/01/22 0745 04/01/22 0750  BP: 121/63   Pulse: (!) 52   Temp: 98 F (36.7 C) 98 F (36.7 C)  TempSrc: Other (Comment)   SpO2: 96%   Weight: 227 lb 12.8 oz (103.3 kg)   Height: 6' 4"$  (1.93 m)    Body mass index is 27.73 kg/m. GEN: NAD EYE: Sclerae anicteric ENT: MMM CV: Non-tachycardic GI: Soft, NT/ND NEURO:  Alert & Oriented x 3  Lab Results: No results for input(s): "WBC", "HGB", "HCT", "PLT" in the last 72 hours. BMET No results for input(s): "NA", "K", "CL", "CO2", "GLUCOSE", "BUN", "CREATININE", "CALCIUM" in the last 72 hours. LFT No results for input(s): "PROT", "ALBUMIN", "AST", "ALT", "ALKPHOS", "BILITOT", "BILIDIR", "IBILI" in the last 72 hours. PT/INR No results for input(s): "LABPROT", "INR" in the last 72 hours.   Impression / Plan: This is a 67 y.o.male who presents for Colonoscopy for surveillance in setting of previous adenomatous polyps.  The risks and benefits of endoscopic evaluation/treatment were discussed with the patient and/or family; these include but are not limited to the risk of perforation, infection, bleeding, missed lesions, lack of diagnosis, severe illness requiring hospitalization, as well as anesthesia and sedation related  illnesses.  The patient's history has been reviewed, patient examined, no change in status, and deemed stable for procedure.  The patient and/or family is agreeable to proceed.    Justice Britain, MD [facility] Office # [id number]

## 2022-04-01 NOTE — Progress Notes (Signed)
Stable to recovery, report RN

## 2022-04-04 ENCOUNTER — Telehealth: Payer: Self-pay | Admitting: *Deleted

## 2022-04-04 NOTE — Telephone Encounter (Signed)
  Follow up Call-     04/01/2022    7:50 AM  Call back number  Post procedure Call Back phone  # [phone]  Permission to leave phone message Yes     Patient questions:  Do you have a fever, pain , or abdominal swelling? No. Pain Score  0 *  Have you tolerated food without any problems? Yes.    Have you been able to return to your normal activities? Yes.    Do you have any questions about your discharge instructions: Diet   No. Medications  No. Follow up visit  No.  Do you have questions or concerns about your Care? No.  Actions: * If pain score is 4 or above: No action needed, pain <4.

## 2022-05-17 ENCOUNTER — Other Ambulatory Visit: Payer: Self-pay

## 2022-05-17 ENCOUNTER — Emergency Department (HOSPITAL_COMMUNITY): Payer: Medicare HMO

## 2022-05-17 ENCOUNTER — Emergency Department (HOSPITAL_COMMUNITY)
Admission: EM | Admit: 2022-05-17 | Discharge: 2022-05-17 | Disposition: A | Discharge status: Home / Self Care | Payer: Medicare HMO | Attending: Emergency Medicine | Admitting: Emergency Medicine

## 2022-05-17 DIAGNOSIS — R112 Nausea with vomiting, unspecified: Secondary | ICD-10-CM | POA: Diagnosis not present

## 2022-05-17 DIAGNOSIS — Z8546 Personal history of malignant neoplasm of prostate: Secondary | ICD-10-CM | POA: Diagnosis not present

## 2022-05-17 DIAGNOSIS — Y900 Blood alcohol level of less than 20 mg/100 ml: Secondary | ICD-10-CM | POA: Diagnosis not present

## 2022-05-17 DIAGNOSIS — E860 Dehydration: Secondary | ICD-10-CM | POA: Diagnosis not present

## 2022-05-17 DIAGNOSIS — R42 Dizziness and giddiness: Secondary | ICD-10-CM | POA: Diagnosis present

## 2022-05-17 DIAGNOSIS — R519 Headache, unspecified: Secondary | ICD-10-CM | POA: Insufficient documentation

## 2022-05-17 DIAGNOSIS — R001 Bradycardia, unspecified: Secondary | ICD-10-CM | POA: Insufficient documentation

## 2022-05-17 LAB — BASIC METABOLIC PANEL
Anion gap: 5 (ref 5–15)
BUN: 13 mg/dL (ref 8–23)
CO2: 24 mmol/L (ref 22–32)
Calcium: 6.9 mg/dL — ABNORMAL LOW (ref 8.9–10.3)
Chloride: 113 mmol/L — ABNORMAL HIGH (ref 98–111)
Creatinine, Ser: 0.86 mg/dL (ref 0.61–1.24)
GFR, Estimated: >60 mL/min (ref >60)
Glucose, Bld: 105 mg/dL — ABNORMAL HIGH (ref 70–99)
Potassium: 3 mmol/L — ABNORMAL LOW (ref 3.5–5.1)
Sodium: 142 mmol/L (ref 135–145)

## 2022-05-17 LAB — CBC WITH DIFFERENTIAL/PLATELET
Abs Immature Granulocytes: 0.03 10*3/uL (ref 0.00–0.07)
Basophils Absolute: 0.1 10*3/uL (ref 0.0–0.1)
Basophils Relative: 1 %
Eosinophils Absolute: 0.2 10*3/uL (ref 0.0–0.5)
Eosinophils Relative: 3 %
HCT: 36.7 % — ABNORMAL LOW (ref 39.0–52.0)
Hemoglobin: 12.2 g/dL — ABNORMAL LOW (ref 13.0–17.0)
Immature Granulocytes: 0 %
Lymphocytes Relative: 24 %
Lymphs Abs: 1.6 10*3/uL (ref 0.7–4.0)
MCH: 30.9 pg (ref 26.0–34.0)
MCHC: 33.2 g/dL (ref 30.0–36.0)
MCV: 92.9 fL (ref 80.0–100.0)
Monocytes Absolute: 0.6 10*3/uL (ref 0.1–1.0)
Monocytes Relative: 8 %
Neutro Abs: 4.4 10*3/uL (ref 1.7–7.7)
Neutrophils Relative %: 64 %
Platelets: 156 10*3/uL (ref 150–400)
RBC: 3.95 MIL/uL — ABNORMAL LOW (ref 4.22–5.81)
RDW: 12.8 % (ref 11.5–15.5)
WBC: 6.8 10*3/uL (ref 4.0–10.5)
nRBC: 0 % (ref 0.0–0.2)

## 2022-05-17 LAB — ETHANOL: Alcohol, Ethyl (B): <10 mg/dL (ref <10)

## 2022-05-17 MED ORDER — MECLIZINE HCL 25 MG PO TABS: 25.0000 mg | ORAL_TABLET | Freq: Two times a day (BID) | ORAL | 0 refills | Status: AC | PRN

## 2022-05-17 MED ORDER — SODIUM CHLORIDE 0.9 % IV BOLUS
1000.0000 mL | Freq: Once | INTRAVENOUS | Status: AC
  Administered 2022-05-17: 1000 mL via INTRAVENOUS

## 2022-05-17 MED ORDER — POTASSIUM CHLORIDE 10 MEQ/100ML IV SOLN
10.0000 meq | INTRAVENOUS | Status: AC
  Administered 2022-05-17 (×2): 10 meq via INTRAVENOUS
  Filled 2022-05-17 (×2): qty 100

## 2022-05-17 MED ORDER — MECLIZINE HCL 25 MG PO TABS
25.0000 mg | ORAL_TABLET | Freq: Once | ORAL | Status: AC
  Administered 2022-05-17: 25 mg via ORAL
  Filled 2022-05-17: qty 1

## 2022-05-17 NOTE — ED Notes (Signed)
Ambulated pt down the hall and back to bed. No complaints other than a little unsteadiness standing up out of bed which resolved momentarily while walking.

## 2022-05-17 NOTE — ED Triage Notes (Signed)
Pt arrives to ED c/o dizziness when walking to bathroom to vomit. Pt endorses that he lost his balance and fell to floor without injury, pt also endorses HA. Pt given 4mg  of zofran per EMS

## 2022-05-17 NOTE — Discharge Instructions (Signed)
You are seen today for dizziness and some vomiting.  Your symptoms were consistent with vertigo.  Make sure that you are staying hydrated.  Take meclizine as needed for any recurrent symptoms.  If you develop any strokelike symptoms, you should be reevaluated immediately.

## 2022-05-17 NOTE — ED Provider Notes (Signed)
[facility] Provider Note   CSN: [id number] Arrival date & time: 05/17/22  0101     History  Chief Complaint  Patient presents with   Dizziness   Nausea   Emesis    VINAL NOSBISCH is a 67 y.o. male.  HPI     This is a 67 year old male who presents with dizziness and vomiting.  Patient reports that he went to bed normally last night.  He woke up to go to the bathroom and had intense room spinning dizziness.  He felt like he needed to vomit.  His wife helped him to the bathroom where he subsequently vomited.  He had recurrence of intense dizziness and lowered himself to the ground.  He denies falling or hitting his head.  He reports "a headache behind both my eyes."  Denies weakness, numbness, tingling, other strokelike symptoms.  He has never had an episode like this before.  Denies alcohol or drug use.  Home Medications Prior to Admission medications   Medication Sig Start Date End Date Taking? Authorizing Provider  meclizine (ANTIVERT) 25 MG tablet Take 1 tablet (25 mg total) by mouth 2 (two) times daily as needed for dizziness. 05/17/22  Yes Esli Clements, Barbette Hair, MD  apalutamide (ERLEADA) 60 MG tablet Take 240 mg by mouth daily. Patient not taking: Reported on 04/01/2022    [provider]  docusate sodium (COLACE) 100 MG capsule Take 100 mg by mouth daily. Patient not taking: Reported on 04/01/2022    [provider]  dutasteride (AVODART) 0.5 MG capsule Take 0.5 mg by mouth daily. Patient not taking: Reported on 12/27/2021 12/09/21   [provider]  finasteride (PROSCAR) 5 MG tablet Take 5 mg by mouth daily. Patient not taking: Reported on 04/01/2022    [provider]  tamsulosin (FLOMAX) 0.4 MG CAPS capsule Take 0.4 mg by mouth daily. Patient not taking: Reported on 04/01/2022 12/24/21   [provider]      Allergies    Penicillins    Review of Systems   Review of Systems   Constitutional:  Negative for fever.  Gastrointestinal:  Positive for vomiting.  Neurological:  Positive for dizziness. Negative for weakness and numbness.  All other systems reviewed and are negative.   Physical Exam Updated Vital Signs BP 121/73   Pulse (!) 40   Temp 97.9 F (36.6 C) (Oral)   Resp 16   Ht 1.93 m (6\' 4" )   Wt 99.8 kg   SpO2 98%   BMI 26.78 kg/m  Physical Exam Vitals and nursing note reviewed.  Constitutional:      Appearance: He is well-developed. He is not ill-appearing.  HENT:     Head: Normocephalic and atraumatic.  Eyes:     Extraocular Movements: Extraocular movements intact.     Pupils: Pupils are equal, round, and reactive to light.     Comments: No nystagmus noted  Cardiovascular:     Rate and Rhythm: Normal rate and regular rhythm.     Heart sounds: Normal heart sounds. No murmur heard. Pulmonary:     Effort: Pulmonary effort is normal. No respiratory distress.     Breath sounds: Normal breath sounds. No wheezing.  Abdominal:     Palpations: Abdomen is soft.     Tenderness: There is no abdominal tenderness. There is no rebound.  Musculoskeletal:     Cervical back: Neck supple.  Lymphadenopathy:     Cervical: No cervical adenopathy.  Skin:  General: Skin is warm and dry.  Neurological:     Mental Status: He is alert and oriented to person, place, and time.     Comments: Cranial nerves II through XII intact, 5 out of 5 strength in all 4 extremities, no dysmetria to finger-nose-finger  Psychiatric:        Mood and Affect: Mood normal.     ED Results / Procedures / Treatments   Labs (all labs ordered are listed, but only abnormal results are displayed) Labs Reviewed  CBC WITH DIFFERENTIAL/PLATELET - Abnormal; Notable for the following components:      Result Value   RBC 3.95 (*)    Hemoglobin 12.2 (*)    HCT 36.7 (*)    All other components within normal limits  BASIC METABOLIC PANEL - Abnormal; Notable for the following  components:   Potassium 3.0 (*)    Chloride 113 (*)    Glucose, Bld 105 (*)    Calcium 6.9 (*)    All other components within normal limits  ETHANOL    EKG EKG Interpretation  Date/Time:  Tuesday May 17 2022 01:34:56 EDT Ventricular Rate:  44 PR Interval:  287 QRS Duration: 105 QT Interval:  454 QTC Calculation: 389 R Axis:   89 Text Interpretation: Sinus bradycardia Prolonged PR interval Borderline right axis deviation Low voltage, precordial leads Confirmed by Thayer Jew [phone]) on 05/17/2022 6:15:05 AM  Radiology CT Head Wo Contrast  Result Date: 05/17/2022 CLINICAL DATA:  Vertigo.  Dizziness and vomiting. EXAM: CT HEAD WITHOUT CONTRAST TECHNIQUE: Contiguous axial images were obtained from the base of the skull through the vertex without intravenous contrast. RADIATION DOSE REDUCTION: This exam was performed according to the departmental dose-optimization program which includes automated exposure control, adjustment of the mA and/or kV according to patient size and/or use of iterative reconstruction technique. COMPARISON:  None Available. FINDINGS: Brain: No evidence of acute infarction, hemorrhage, hydrocephalus, extra-axial collection or mass lesion/mass effect. Vascular: No hyperdense vessel or unexpected calcification. Skull: Normal. Negative for fracture or focal lesion. Sinuses/Orbits: No acute finding. IMPRESSION: Negative head CT. Electronically Signed   By: Jorje Guild M.D.   On: 05/17/2022 04:50    Procedures Procedures    Medications Ordered in ED Medications  sodium chloride 0.9 % bolus 1,000 mL (0 mLs Intravenous Stopped 05/17/22 0325)  meclizine (ANTIVERT) tablet 25 mg (25 mg Oral Given 05/17/22 0131)  potassium chloride 10 mEq in 100 mL IVPB (0 mEq Intravenous Stopped 05/17/22 0528)    ED Course/ Medical Decision Making/ A&P                             Medical Decision Making Amount and/or Complexity of Data Reviewed Labs: ordered. Radiology:  ordered.  Risk Prescription drug management.   This patient presents to the ED for concern of dizziness, vomiting, this involves an extensive number of treatment options, and is a complaint that carries with it a high risk of complications and morbidity.  I considered the following differential and admission for this acute, potentially life threatening condition.  The differential diagnosis includes vertigo, peripheral versus central, dehydration, gastritis, metabolic derangements  MDM:    This is a 67 year old male who presents with concerns for dizziness and vomiting.  He is overall nontoxic.  Improved since being given Zofran by EMS.  Reports some changes with standing but is not ataxic.  His neurologic exam is normal with normal finger-nose-finger.  He does report  some headache.  Patient was given meclizine and fluids.  He was not technically orthostatic but did get dizzy upon standing.  CT head was obtained given headache.  No obvious bleed or upper abnormality.  Labs obtained and reviewed.  Mild hypokalemia.  This was replaced.  Patient progressively improved.  He was able to ambulate in the hallway with no obvious neurologic deficits and states that he was at his baseline.  I have low suspicion for central cause at this time.  We did discuss potential MRI; however, I feel it is more probable to be peripheral vertigo and will defer MRI.  He and his wife are agreeable to plan.  He is at his baseline.  (Labs, imaging, consults)  Labs: I Ordered, and personally interpreted labs.  The pertinent results include:  CBC, BMP, ethanol  Imaging Studies ordered: I ordered imaging studies including CT head I independently visualized and interpreted imaging. I agree with the radiologist interpretation  Additional history obtained from wife and EMS.  External records from outside source obtained and reviewed including prior evaluations  Cardiac Monitoring: The patient was maintained on a cardiac  monitor.  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    Dispense:  30 tablet    Refill:  0    I have reviewed the patients home medicines and have made adjustments as needed  Problem List / ED Course: Problem List Items Addressed This Visit   None Visit Diagnoses     Vertigo    -  Primary   Bradycardia       Dehydration                       Final Clinical Impression(s) / ED Diagnoses Final diagnoses:  Vertigo  Bradycardia  Dehydration    Rx / DC Orders ED Discharge Orders          Ordered    meclizine (ANTIVERT) 25 MG tablet  2 times daily PRN        05/17/22 [id number]              Merryl Hacker, MD 05/17/22 [phone]

## 2022-05-17 NOTE — ED Notes (Signed)
Pt able to ambulate with no difficulty
